# Patient Record
Sex: Male | Born: 1955 | Race: Black or African American | Hispanic: No | Marital: Single | Smoking: Current every day smoker
Health system: Southern US, Community
[De-identification: ages and names within clinical notes are randomized; demographics above are authoritative.]

## PROBLEM LIST (undated history)

## (undated) DIAGNOSIS — M1990 Unspecified osteoarthritis, unspecified site: Secondary | ICD-10-CM

---

## 2007-01-08 ENCOUNTER — Emergency Department: Payer: Self-pay | Admitting: Emergency Medicine

## 2008-05-19 ENCOUNTER — Emergency Department: Payer: Self-pay | Admitting: Emergency Medicine

## 2011-08-15 ENCOUNTER — Emergency Department: Payer: Self-pay | Admitting: Emergency Medicine

## 2011-08-19 ENCOUNTER — Emergency Department: Payer: Self-pay | Admitting: Emergency Medicine

## 2012-03-06 ENCOUNTER — Emergency Department: Payer: Self-pay | Admitting: Emergency Medicine

## 2014-11-22 ENCOUNTER — Emergency Department: Payer: Self-pay | Admitting: Emergency Medicine

## 2014-11-22 LAB — URIC ACID: Uric Acid: 5.3 mg/dL (ref 3.5–7.2)

## 2015-09-28 ENCOUNTER — Encounter: Payer: Self-pay | Admitting: Emergency Medicine

## 2015-09-28 ENCOUNTER — Emergency Department
Admission: EM | Admit: 2015-09-28 | Discharge: 2015-09-28 | Disposition: A | Discharge status: Home / Self Care | Payer: BLUE CROSS/BLUE SHIELD | Attending: Emergency Medicine | Admitting: Emergency Medicine

## 2015-09-28 DIAGNOSIS — Z720 Tobacco use: Secondary | ICD-10-CM | POA: Diagnosis not present

## 2015-09-28 DIAGNOSIS — K0889 Other specified disorders of teeth and supporting structures: Secondary | ICD-10-CM | POA: Diagnosis present

## 2015-09-28 DIAGNOSIS — K047 Periapical abscess without sinus: Secondary | ICD-10-CM

## 2015-09-28 MED ORDER — TRAMADOL HCL 50 MG PO TABS: 50.0000 mg | ORAL_TABLET | Freq: Four times a day (QID) | ORAL | Status: DC | PRN

## 2015-09-28 MED ORDER — IBUPROFEN 800 MG PO TABS: 800.0000 mg | ORAL_TABLET | Freq: Three times a day (TID) | ORAL | Status: DC | PRN

## 2015-09-28 MED ORDER — IBUPROFEN 800 MG PO TABS
800.0000 mg | ORAL_TABLET | Freq: Once | ORAL | Status: AC
  Administered 2015-09-28: 800 mg via ORAL

## 2015-09-28 MED ORDER — IBUPROFEN 800 MG PO TABS
ORAL_TABLET | ORAL | Status: AC
  Administered 2015-09-28: 800 mg via ORAL
  Filled 2015-09-28: qty 1

## 2015-09-28 MED ORDER — AMOXICILLIN 500 MG PO TABS: 500.0000 mg | ORAL_TABLET | Freq: Three times a day (TID) | ORAL | Status: DC

## 2015-09-28 NOTE — ED Notes (Signed)
Pt c/o pain to his top row of teeth that is now causing swelling. Pt reports pain started yesterday and he has no dentist. Denies fevers.

## 2015-09-28 NOTE — Discharge Instructions (Signed)
Dental Abscess °A dental abscess is a collection of pus in or around a tooth. °CAUSES °This condition is caused by a bacterial infection around the root of the tooth that involves the inner part of the tooth (pulp). It may result from: °· Severe tooth decay. °· Trauma to the tooth that allows bacteria to enter into the pulp, such as a broken or chipped tooth. °· Severe gum disease around a tooth. °SYMPTOMS °Symptoms of this condition include: °· Severe pain in and around the infected tooth. °· Swelling and redness around the infected tooth, in the mouth, or in the face. °· Tenderness. °· Pus drainage. °· Bad breath. °· Bitter taste in the mouth. °· Difficulty swallowing. °· Difficulty opening the mouth. °· Nausea. °· Vomiting. °· Chills. °· Swollen neck glands. °· Fever. °DIAGNOSIS °This condition is diagnosed with examination of the infected tooth. During the exam, your dentist may tap on the infected tooth. Your dentist will also ask about your medical and dental history and may order X-rays. °TREATMENT °This condition is treated by eliminating the infection. This may be done with: °· Antibiotic medicine. °· A root canal. This may be performed to save the tooth. °· Pulling (extracting) the tooth. This may also involve draining the abscess. This is done if the tooth cannot be saved. °HOME CARE INSTRUCTIONS °· Take medicines only as directed by your dentist. °· If you were prescribed antibiotic medicine, finish all of it even if you start to feel better. °· Rinse your mouth (gargle) often with salt water to relieve pain or swelling. °· Do not drive or operate heavy machinery while taking pain medicine. °· Do not apply heat to the outside of your mouth. °· Keep all follow-up visits as directed by your dentist. This is important. °SEEK MEDICAL CARE IF: °· Your pain is worse and is not helped by medicine. °SEEK IMMEDIATE MEDICAL CARE IF: °· You have a fever or chills. °· Your symptoms suddenly get worse. °· You have a  very bad headache. °· You have problems breathing or swallowing. °· You have trouble opening your mouth. °· You have swelling in your neck or around your eye. °  °This information is not intended to replace advice given to you by your health care provider. Make sure you discuss any questions you have with your health care provider. °  °Document Released: 11/18/2005 Document Revised: 04/04/2015 Document Reviewed: 11/15/2014 °Elsevier Interactive Patient Education ©2016 [employer] ° ° ° ° ° ° °OPTIONS FOR DENTAL FOLLOW UP CARE ° °[place] Department of Health and Human Services - Local Safety Net Dental Clinics °http://www.ncdhhs.gov/dph/oralhealth/services/safetynetclinics.htm °  °[facility] (336-562-3123) ° °[facility] (919-933-9087) ° °[facility] (919-663-1744 ext 237) ° °[facility] (336-570-6415) ° °[facility] (919-968-2025) °This clinic caters to the indigent population and is on a lottery system. °Location: °[facility], [address], [place] °Clinic Hours: °Wednesdays from 6pm - 9pm, patients seen by a lottery system. °For dates, call or go to www.med.[facility].edu/[facility]/patients/Dental-[facility] °Services: °Cleanings, fillings and simple extractions. °Payment Options: °DENTAL WORK IS FREE OF CHARGE. Bring proof of income or support. °Best way to get seen: °Arrive at 5:15 pm - this is a lottery, NOT first come/first serve, so arriving earlier will not increase your chances of being seen. °  °  °[facility] °919-537-3737 °Select option 1 for emergencies °  °Location: °[facility], [address], [place] °  Clinic Hours: °No walk-ins accepted - call the day before to schedule an appointment. °Check in times are 9:30 am and 1:30 pm. °Services: °Simple extractions, temporary fillings, pulpectomy/pulp debridement, uncomplicated abscess drainage. °Payment Options: °PAYMENT IS  DUE AT THE TIME OF SERVICE.  Fee is usually $100-200, additional surgical procedures (e.g. abscess drainage) may be extra. °Cash, checks, Visa/MasterCard accepted.  Can file Medicaid if patient is covered for dental - patient should call case worker to check. °No discount for [facility] patients. °Best way to get seen: °MUST call the day before and get onto the schedule. Can usually be seen the next 1-2 days. No walk-ins accepted. °  °  °[facility] °[phone] °  °Location: °[facility], [address], [place] °Clinic Hours: °M, W, Th, F 8am or 1:30pm, Tues 9a or 1:30 - first come/first served. °Services: °Simple extractions, temporary fillings, uncomplicated abscess drainage.  You do not need to be an [place] resident. °Payment Options: °PAYMENT IS DUE AT THE TIME OF SERVICE. °Dental insurance, otherwise sliding scale - bring proof of income or support. °Depending on income and treatment needed, cost is usually $50-200. °Best way to get seen: °Arrive early as it is first come/first served. °  °  °[facility] °919-542-1641 °  °Location: °[address] °Clinic Hours: °Mon-Thu 8a-5p °Services: °Most basic dental services including extractions and fillings. °Payment Options: °PAYMENT IS DUE AT THE TIME OF SERVICE. °Sliding scale, up to 50% off - bring proof if income or support. °Medicaid with dental option accepted. °Best way to get seen: °Call to schedule an appointment, can usually be seen within 2 weeks OR they will try to see walk-ins - show up at 8a or 2p (you may have to wait). °  °  °[facility] °919-245-2435 °[place] RESIDENTS ONLY °  °Location: °[facility], [address], [place], [place] [address] °Clinic Hours: By appointment only. °Monday - Thursday 8am-5pm, Friday 8am-12pm °Services: Cleanings, fillings, extractions. °Payment Options: °PAYMENT IS DUE AT THE TIME OF  SERVICE. °Cash, Visa or MasterCard. Sliding scale - $30 minimum per service. °Best way to get seen: °Come in to office, complete packet and make an appointment - need proof of income °or support monies for each household member and proof of [place] residence. °Usually takes about a month to get in. °  °  °[facility] °[phone] °  °Location: °[address] [place] °Clinic Hours: Walk-in Urgent Care Dental Services are offered Monday-Friday mornings only. °The numbers of emergencies accepted daily is limited to the number of °providers available. °Maximum 15 - Mondays, Wednesdays & Thursdays °Maximum 10 - Tuesdays & Fridays °Services: °You do not need to be a [place] resident to be seen for a dental emergency. °Emergencies are defined as pain, swelling, abnormal bleeding, or dental trauma. Walkins will receive x-rays if needed. °NOTE: Dental cleaning is not an emergency. °Payment Options: °PAYMENT IS DUE AT THE TIME OF SERVICE. °Minimum co-pay is $40.00 for uninsured patients. °Minimum co-pay is $3.00 for Medicaid with dental coverage. °Dental Insurance is accepted and must be presented at time of visit. °Medicare does not cover dental. °Forms of payment: Cash, credit card, checks. °Best way to get seen: °If not previously registered with the clinic, walk-in dental registration begins at 7:15 am and is on a first come/first serve basis. °If previously registered with the clinic, call to make an appointment. °  °  °  [facility] °919-776-4359 °[place] RESIDENTS ONLY °  °Location: °[address], [place], [place] °Clinic Hours: °Mon-Thu 10a-2p °Services: Extractions only! °Payment Options: °FREE (donations accepted) - bring proof of income or support °Best way to get seen: °Call and schedule an appointment OR come at 8am on the 1st Monday of every month (except for holidays) when it is first come/first served. °  °  °[facility] °[phone] °   °Location: °[address], [place] °Clinic Hours: °Friday mornings °Services, Payment Options, Best way to get seen: °Call for info °

## 2015-09-28 NOTE — ED Provider Notes (Signed)
[facility] Emergency Department Provider Note  ____________________________________________  Time seen: Approximately 11:24 AM  I have reviewed the triage vital signs and the nursing notes.   HISTORY  Chief Complaint Dental Pain   HPI Shawn Mooney is a 59 y.o. male who presents to the emergency department for evaluation of dental pain. He reports having had pain for quite some time, but it worsened yesterday and he had swelling upon awakening this morning. He has not taken anything for pain.   History reviewed. No pertinent past medical history.  There are no active problems to display for this patient.   History reviewed. No pertinent past surgical history.  Current Outpatient Rx  Name  Route  Sig  Dispense  Refill  . amoxicillin (AMOXIL) 500 MG tablet   Oral   Take 1 tablet (500 mg total) by mouth 3 (three) times daily.   30 tablet   0   . ibuprofen (ADVIL,MOTRIN) 800 MG tablet   Oral   Take 1 tablet (800 mg total) by mouth every 8 (eight) hours as needed.   30 tablet   0   . traMADol (ULTRAM) 50 MG tablet   Oral   Take 1 tablet (50 mg total) by mouth every 6 (six) hours as needed.   9 tablet   0     Allergies Review of patient's allergies indicates no known allergies.  No family history on file.  Social History Social History  Substance Use Topics  . Smoking status: Current Every Day Smoker  . Smokeless tobacco: None  . Alcohol Use: Yes    Review of Systems Constitutional: No fever/chills Eyes: No visual changes. ENT: No sore throat. Cardiovascular: Denies chest pain. Respiratory: Denies shortness of breath. Gastrointestinal: No abdominal pain.  No nausea, no vomiting.  Genitourinary: Negative for dysuria. Musculoskeletal: Negative for back pain. Skin: Negative for rash. Neurological: Negative for headaches, focal weakness or numbness. 10-point ROS otherwise  negative.  ____________________________________________   PHYSICAL EXAM:  VITAL SIGNS: ED Triage Vitals  Enc Vitals Group     BP 09/28/15 1033 153/81 mmHg     Pulse Rate 09/28/15 1033 89     Resp 09/28/15 1033 20     Temp 09/28/15 1033 98.6 F (37 C)     Temp Source 09/28/15 1033 Oral     SpO2 09/28/15 1033 98 %     Weight 09/28/15 1033 215 lb (97.523 kg)     Height 09/28/15 1033 6\' 1"  (1.854 m)     Head Cir --      Peak Flow --      Pain Score 09/28/15 1034 7     Pain Loc --      Pain Edu? --      Excl. in GC? --     Constitutional: Alert and oriented. Well appearing and in no acute distress. Eyes: Conjunctivae are normal. PERRL. EOMI. Head: Atraumatic. Nose: No congestion/rhinnorhea. Mouth/Throat: Mucous membranes are moist.  Oropharynx non-erythematous. Periodontal Exam    Neck: No stridor.  Hematological/Lymphatic/Immunilogical: No cervical lymphadenopathy. Cardiovascular:   Good peripheral circulation. Respiratory: Normal respiratory effort.  No retractions. Musculoskeletal: No lower extremity tenderness nor edema.  No joint effusions. Neurologic:  Normal speech and language. No gross focal neurologic deficits are appreciated. Speech is normal. No gait instability. Skin:  Skin is warm, dry and intact. No rash noted. Psychiatric: Mood and affect are normal. Speech and behavior are normal.  ____________________________________________   LABS (all labs ordered are listed,  but only abnormal results are displayed)  Labs Reviewed - No data to display ____________________________________________   RADIOLOGY  Not indicated. ____________________________________________   PROCEDURES  Procedure(s) performed: None  Critical Care performed: No  ____________________________________________   INITIAL IMPRESSION / ASSESSMENT AND PLAN / ED COURSE  Pertinent labs & imaging results that were available during my care of the patient were reviewed by me and  considered in my medical decision making (see chart for details).  Patient was advised to see the dentist within 14 days. Also advised to take the antibiotic until finished. Instructed to return to the ER for symptoms that change or worsen if you are unable to schedule an appointment. ____________________________________________   FINAL CLINICAL IMPRESSION(S) / ED DIAGNOSES  Final diagnoses:  Dental abscess       Chinita Pester, FNP 09/28/15 1547  Emily Filbert, MD 09/29/15 2249

## 2016-01-09 ENCOUNTER — Encounter: Payer: Self-pay | Admitting: Emergency Medicine

## 2016-01-09 ENCOUNTER — Emergency Department: Payer: BLUE CROSS/BLUE SHIELD

## 2016-01-09 ENCOUNTER — Emergency Department
Admission: EM | Admit: 2016-01-09 | Discharge: 2016-01-09 | Disposition: A | Discharge status: Home / Self Care | Payer: BLUE CROSS/BLUE SHIELD | Attending: Emergency Medicine | Admitting: Emergency Medicine

## 2016-01-09 DIAGNOSIS — Y998 Other external cause status: Secondary | ICD-10-CM | POA: Diagnosis not present

## 2016-01-09 DIAGNOSIS — S8992XA Unspecified injury of left lower leg, initial encounter: Secondary | ICD-10-CM | POA: Diagnosis present

## 2016-01-09 DIAGNOSIS — Y9289 Other specified places as the place of occurrence of the external cause: Secondary | ICD-10-CM | POA: Diagnosis not present

## 2016-01-09 DIAGNOSIS — S86912A Strain of unspecified muscle(s) and tendon(s) at lower leg level, left leg, initial encounter: Secondary | ICD-10-CM | POA: Insufficient documentation

## 2016-01-09 DIAGNOSIS — F17200 Nicotine dependence, unspecified, uncomplicated: Secondary | ICD-10-CM | POA: Diagnosis not present

## 2016-01-09 DIAGNOSIS — Y9389 Activity, other specified: Secondary | ICD-10-CM | POA: Diagnosis not present

## 2016-01-09 DIAGNOSIS — W1839XA Other fall on same level, initial encounter: Secondary | ICD-10-CM | POA: Diagnosis not present

## 2016-01-09 MED ORDER — CYCLOBENZAPRINE HCL 5 MG PO TABS: 5.0000 mg | ORAL_TABLET | Freq: Three times a day (TID) | ORAL | Status: DC | PRN

## 2016-01-09 MED ORDER — IBUPROFEN 800 MG PO TABS: 800.0000 mg | ORAL_TABLET | Freq: Three times a day (TID) | ORAL | Status: DC | PRN

## 2016-01-09 NOTE — ED Notes (Signed)
C/o left knee pain.  

## 2016-01-09 NOTE — ED Provider Notes (Signed)
[facility] Emergency Department Provider Note  ____________________________________________  Time seen: Approximately 10:43 AM  I have reviewed the triage vital signs and the nursing notes.   HISTORY  Chief Complaint Knee Pain    HPI Shawn Mooney is a 60 y.o. male resents for evaluation following yesterday landing on his left side. Complains of having pain to his left knee with no swelling noted. Patient denies any other direct trauma to the knee. Increased pain when trying to ambulate. His pain is 8/10 and nonradiating stating to the middle part of the knee.   History reviewed. No pertinent past medical history.  There are no active problems to display for this patient.   History reviewed. No pertinent past surgical history.  Current Outpatient Rx  Name  Route  Sig  Dispense  Refill  . cyclobenzaprine (FLEXERIL) 5 MG tablet   Oral   Take 1 tablet (5 mg total) by mouth every 8 (eight) hours as needed for muscle spasms.   30 tablet   0   . ibuprofen (ADVIL,MOTRIN) 800 MG tablet   Oral   Take 1 tablet (800 mg total) by mouth every 8 (eight) hours as needed.   30 tablet   0     Allergies Review of patient's allergies indicates no known allergies.  History reviewed. No pertinent family history.  Social History Social History  Substance Use Topics  . Smoking status: Current Every Day Smoker  . Smokeless tobacco: None  . Alcohol Use: Yes    Review of Systems Constitutional: No fever/chills Eyes: No visual changes. ENT: No sore throat. Cardiovascular: Denies chest pain. Respiratory: Denies shortness of breath. Gastrointestinal: No abdominal pain.  No nausea, no vomiting.  No diarrhea.  No constipation. Genitourinary: Negative for dysuria. Musculoskeletal: Positive for left knee pain. Skin: Negative for rash. Neurological: Negative for headaches, focal weakness or numbness.  10-point ROS otherwise  negative.  ____________________________________________   PHYSICAL EXAM:  VITAL SIGNS: ED Triage Vitals  Enc Vitals Group     BP 01/09/16 0918 146/87 mmHg     Pulse Rate 01/09/16 0918 88     Resp 01/09/16 0918 18     Temp 01/09/16 0918 97.8 F (36.6 C)     Temp Source 01/09/16 0918 Oral     SpO2 01/09/16 0918 98 %     Weight 01/09/16 0918 213 lb (96.616 kg)     Height 01/09/16 0918 6\' 2"  (1.88 m)     Head Cir --      Peak Flow --      Pain Score 01/09/16 0914 8     Pain Loc --      Pain Edu? --      Excl. in GC? --     Constitutional: Alert and oriented. Well appearing and in no acute distress. Cardiovascular: Normal rate, regular rhythm. Grossly normal heart sounds.  Good peripheral circulation. Respiratory: Normal respiratory effort.  No retractions. Lungs CTAB. Gastrointestinal: Soft and nontender. No distention. No abdominal bruits. No CVA tenderness. Musculoskeletal: No lower extremity tenderness nor edema.  No joint effusions. Distally neurovascular intact. No ecchymosis or bruising noted. Point tenderness noted to the medial aspect of the left knee. Neurologic:  Normal speech and language. No gross focal neurologic deficits are appreciated. No gait instability. Skin:  Skin is warm, dry and intact. No rash noted. Psychiatric: Mood and affect are normal. Speech and behavior are normal.  ____________________________________________   LABS (all labs ordered are listed, but only abnormal  results are displayed)  Labs Reviewed - No data to display ____________________________________________  RADIOLOGY  Negative for any acute osseous findings. ____________________________________________   PROCEDURES  Procedure(s) performed: None  Critical Care performed: No  ____________________________________________   INITIAL IMPRESSION / ASSESSMENT AND PLAN / ED COURSE  Pertinent labs & imaging results that were available during my care of the patient were reviewed by  me and considered in my medical decision making (see chart for details).  Acute left knee strain. Rx given for Motrin 800 mg 3 times a day and Flexeril 5 mg 3 times a day. Patient follow-up PCP or return to the ER with any worsening symptomology. Patient to continue to ambulate and work excuse given 48 hours. ____________________________________________   FINAL CLINICAL IMPRESSION(S) / ED DIAGNOSES  Final diagnoses:  Knee strain, left, initial encounter     This chart was dictated using voice recognition software/Dragon. Despite best efforts to proofread, errors can occur which can change the meaning. Any change was purely unintentional.   Evangeline Dakin, PA-C 01/09/16 1105  Emily Filbert, MD 01/09/16 [phone]

## 2016-01-09 NOTE — ED Notes (Signed)
States he fell on Sunday  Landed on left side  But having pain to left knee ..,no swelling noted   States pain is lateral  Ambulates with sl limp d/t pain

## 2016-12-19 ENCOUNTER — Emergency Department
Admission: EM | Admit: 2016-12-19 | Discharge: 2016-12-19 | Disposition: A | Discharge status: Home / Self Care | Payer: BLUE CROSS/BLUE SHIELD | Attending: Emergency Medicine | Admitting: Emergency Medicine

## 2016-12-19 ENCOUNTER — Emergency Department: Payer: BLUE CROSS/BLUE SHIELD

## 2016-12-19 DIAGNOSIS — M1000 Idiopathic gout, unspecified site: Secondary | ICD-10-CM | POA: Insufficient documentation

## 2016-12-19 DIAGNOSIS — F17200 Nicotine dependence, unspecified, uncomplicated: Secondary | ICD-10-CM | POA: Insufficient documentation

## 2016-12-19 DIAGNOSIS — Z791 Long term (current) use of non-steroidal anti-inflammatories (NSAID): Secondary | ICD-10-CM | POA: Diagnosis not present

## 2016-12-19 DIAGNOSIS — M109 Gout, unspecified: Secondary | ICD-10-CM

## 2016-12-19 DIAGNOSIS — M7989 Other specified soft tissue disorders: Secondary | ICD-10-CM | POA: Diagnosis present

## 2016-12-19 MED ORDER — NAPROXEN 500 MG PO TABS
500.0000 mg | ORAL_TABLET | Freq: Once | ORAL | Status: AC
  Administered 2016-12-19: 500 mg via ORAL
  Filled 2016-12-19: qty 1

## 2016-12-19 MED ORDER — INDOMETHACIN 50 MG PO CAPS: 50.0000 mg | ORAL_CAPSULE | Freq: Three times a day (TID) | ORAL | 0 refills | Status: DC

## 2016-12-19 NOTE — ED Provider Notes (Signed)
[facility] Emergency Department Provider Note ____________________________________________  Time seen: Approximately 4:34 PM  I have reviewed the triage vital signs and the nursing notes.   HISTORY  Chief Complaint Hand Pain    HPI Shawn Mooney is a 61 y.o. male who presents to the emergency department for evaluation of right ring finger is swelling and pain. He states that the symptoms have been present intermittently for the past year. He denies injury. Swelling got worse several days ago. He has not taken any medications for pain relief, though he has soaked his hand in Epsom salt.  No past medical history on file.  There are no active problems to display for this patient.   No past surgical history on file.  Prior to Admission medications   Medication Sig Start Date End Date Taking? Authorizing Provider  cyclobenzaprine (FLEXERIL) 5 MG tablet Take 1 tablet (5 mg total) by mouth every 8 (eight) hours as needed for muscle spasms. 01/09/16   Charmayne Sheer Beers, PA-C  ibuprofen (ADVIL,MOTRIN) 800 MG tablet Take 1 tablet (800 mg total) by mouth every 8 (eight) hours as needed. 01/09/16   Evangeline Dakin, PA-C    Allergies Patient has no known allergies.  No family history on file.  Social History Social History  Substance Use Topics  . Smoking status: Current Every Day Smoker  . Smokeless tobacco: Not on file  . Alcohol use Yes    Review of Systems Constitutional: No recent illness. Cardiovascular: Denies chest pain or palpitations. Respiratory: Denies shortness of breath. Musculoskeletal: Pain in Right ring finger. Skin: Negative for rash, wound, lesion. Neurological: Negative for focal weakness or numbness.  ____________________________________________   PHYSICAL EXAM:  VITAL SIGNS: ED Triage Vitals  Enc Vitals Group     BP 12/19/16 1523 (!) 168/100     Pulse Rate 12/19/16 1523 84     Resp 12/19/16 1523 18     Temp 12/19/16 1523  98.1 F (36.7 C)     Temp Source 12/19/16 1523 Oral     SpO2 12/19/16 1523 99 %     Weight 12/19/16 1523 214 lb (97.1 kg)     Height 12/19/16 1523 6\' 1"  (1.854 m)     Head Circumference --      Peak Flow --      Pain Score 12/19/16 1524 8     Pain Loc --      Pain Edu? --      Excl. in GC? --     Constitutional: Alert and oriented. Well appearing and in no acute distress. Eyes: Conjunctivae are normal. EOMI. Head: Atraumatic. Neck: No stridor.  Respiratory: Normal respiratory effort.   Musculoskeletal: Right ring finger PIP swollen and tender to palpation. Neurologic:  Normal speech and language. No gross focal neurologic deficits are appreciated. Speech is normal. No gait instability. Skin:  Skin is warm, dry and intact. Atraumatic. Psychiatric: Mood and affect are normal. Speech and behavior are normal.  ____________________________________________   LABS (all labs ordered are listed, but only abnormal results are displayed)  Labs Reviewed - No data to display ____________________________________________  RADIOLOGY  Right ring finger x-ray consistent with gouty arthritis. ____________________________________________   PROCEDURES  Procedure(s) performed: None   ____________________________________________   INITIAL IMPRESSION / ASSESSMENT AND PLAN / ED COURSE     Pertinent labs & imaging results that were available during my care of the patient were reviewed by me and considered in my medical decision making (see chart for  details).  61 year old male presented to the emergency department for evaluation of pain and swelling of the right ring finger. X-ray and symptoms are consistent with gouty arthritis. Today he was prescribed indomethacin and advised to take it 3 times a day with meals. He was instructed to follow up with primary care or orthopedics for symptoms that are not improving with medication. He was instructed to return to the emergency department for  symptoms that change or worsen if he is unable schedule an appointment. ____________________________________________   FINAL CLINICAL IMPRESSION(S) / ED DIAGNOSES  Final diagnoses:  None       Chinita Pester, FNP 12/19/16 0600    Jennye Moccasin, MD 12/20/16 0008

## 2016-12-19 NOTE — ED Notes (Signed)
Pt with swelling to R ring finger, denies injury or hx of gout. Pt states he has soaked hand in epsom salt and denies pain at this time. States swelling x 1 year consistently.

## 2016-12-19 NOTE — ED Triage Notes (Signed)
Pt reports right ring finger pain intermittently for one year. Pt reports pain worse the past couple days and has been swelling. Denies injury.

## 2016-12-19 NOTE — Discharge Instructions (Signed)
Take the medication as prescribed. Follow up with the orthopedic doctor or your primary care provider for symptoms that are not improving over the next 5-7 days. Return to the ER for symptoms that change or worsen if you are unable to schedule an appointment.

## 2018-02-17 ENCOUNTER — Emergency Department
Admission: EM | Admit: 2018-02-17 | Discharge: 2018-02-17 | Disposition: A | Discharge status: Home / Self Care | Payer: BLUE CROSS/BLUE SHIELD | Attending: Emergency Medicine | Admitting: Emergency Medicine

## 2018-02-17 ENCOUNTER — Other Ambulatory Visit: Payer: Self-pay

## 2018-02-17 ENCOUNTER — Encounter: Payer: Self-pay | Admitting: Emergency Medicine

## 2018-02-17 ENCOUNTER — Emergency Department: Payer: BLUE CROSS/BLUE SHIELD

## 2018-02-17 DIAGNOSIS — M1812 Unilateral primary osteoarthritis of first carpometacarpal joint, left hand: Secondary | ICD-10-CM | POA: Insufficient documentation

## 2018-02-17 DIAGNOSIS — Z79899 Other long term (current) drug therapy: Secondary | ICD-10-CM | POA: Insufficient documentation

## 2018-02-17 DIAGNOSIS — M79645 Pain in left finger(s): Secondary | ICD-10-CM | POA: Diagnosis present

## 2018-02-17 DIAGNOSIS — F17200 Nicotine dependence, unspecified, uncomplicated: Secondary | ICD-10-CM | POA: Diagnosis not present

## 2018-02-17 LAB — CBC WITH DIFFERENTIAL/PLATELET
Basophils Absolute: 0 10*3/uL (ref 0–0.1)
Basophils Relative: 1 %
EOS ABS: 0.1 10*3/uL (ref 0–0.7)
EOS PCT: 2 %
HCT: 37.4 % — ABNORMAL LOW (ref 40.0–52.0)
HEMOGLOBIN: 12.7 g/dL — AB (ref 13.0–18.0)
LYMPHS PCT: 29 %
Lymphs Abs: 1.1 10*3/uL (ref 1.0–3.6)
MCH: 32.6 pg (ref 26.0–34.0)
MCHC: 34.1 g/dL (ref 32.0–36.0)
MCV: 95.8 fL (ref 80.0–100.0)
Monocytes Absolute: 0.5 10*3/uL (ref 0.2–1.0)
Monocytes Relative: 13 %
NEUTROS PCT: 55 %
Neutro Abs: 2.1 10*3/uL (ref 1.4–6.5)
PLATELETS: 210 10*3/uL (ref 150–440)
RBC: 3.91 MIL/uL — AB (ref 4.40–5.90)
RDW: 14.7 % — ABNORMAL HIGH (ref 11.5–14.5)
WBC: 3.8 10*3/uL (ref 3.8–10.6)

## 2018-02-17 LAB — SEDIMENTATION RATE: SED RATE: 36 mm/h — AB (ref 0–20)

## 2018-02-17 LAB — URIC ACID: Uric Acid, Serum: 5.9 mg/dL (ref 4.4–7.6)

## 2018-02-17 MED ORDER — MELOXICAM 7.5 MG PO TABS: 7.5000 mg | ORAL_TABLET | Freq: Every day | ORAL | 2 refills | Status: DC

## 2018-02-17 NOTE — ED Triage Notes (Signed)
Developed pain and swelling to left thumb area since Friday  Unsure of injury

## 2018-02-17 NOTE — ED Provider Notes (Signed)
[facility] Emergency Department Provider Note   ____________________________________________   First MD Initiated Contact with Patient 02/17/18 1207     (approximate)  I have reviewed the triage vital signs and the nursing notes.   HISTORY  Chief Complaint Hand Pain    HPI Shawn Mooney is a 62 y.o. male patient complain of pain and swelling to the left thumb for 4 days.  Patient cannot think of a provocative incident but does perform repetitive heavy lifting at work.  Patient rates the pain as 8/10.  Patient described the pain is "aching".  No palliative measure for complaint.  Patient is right-hand dominant.  History reviewed. No pertinent past medical history.  There are no active problems to display for this patient.   History reviewed. No pertinent surgical history.  Prior to Admission medications   Medication Sig Start Date End Date Taking? Authorizing Provider  cyclobenzaprine (FLEXERIL) 5 MG tablet Take 1 tablet (5 mg total) by mouth every 8 (eight) hours as needed for muscle spasms. 01/09/16   Beers, Charmayne Sheer, PA-C  ibuprofen (ADVIL,MOTRIN) 800 MG tablet Take 1 tablet (800 mg total) by mouth every 8 (eight) hours as needed. 01/09/16   Beers, Charmayne Sheer, PA-C  indomethacin (INDOCIN) 50 MG capsule Take 1 capsule (50 mg total) by mouth 3 (three) times daily with meals. 12/19/16   Triplett, Rulon Eisenmenger B, FNP  meloxicam (MOBIC) 7.5 MG tablet Take 1 tablet (7.5 mg total) by mouth daily. 02/17/18   Joni Reining, PA-C    Allergies Patient has no known allergies.  No family history on file.  Social History Social History   Tobacco Use  . Smoking status: Current Every Day Smoker  . Smokeless tobacco: Never Used  Substance Use Topics  . Alcohol use: Yes  . Drug use: No    Review of Systems Constitutional: No fever/chills Eyes: No visual changes. ENT: No sore throat. Cardiovascular: Denies chest pain. Respiratory: Denies shortness of  breath. Gastrointestinal: No abdominal pain.  No nausea, no vomiting.  No diarrhea.  No constipation. Genitourinary: Negative for dysuria. Musculoskeletal: Left thumb pain. Skin: Negative for rash. Neurological: Negative for headaches, focal weakness or numbness.   ____________________________________________   PHYSICAL EXAM:  VITAL SIGNS: ED Triage Vitals  Enc Vitals Group     BP 02/17/18 1200 (!) 146/92     Pulse Rate 02/17/18 1200 78     Resp 02/17/18 1200 14     Temp 02/17/18 1200 98.1 F (36.7 C)     Temp Source 02/17/18 1200 Oral     SpO2 02/17/18 1200 99 %     Weight 02/17/18 1157 220 lb (99.8 kg)     Height 02/17/18 1157 6\' 2"  (1.88 m)     Head Circumference --      Peak Flow --      Pain Score 02/17/18 1157 8     Pain Loc --      Pain Edu? --      Excl. in GC? --    Constitutional: Alert and oriented. Well appearing and in no acute distress. Cardiovascular: Normal rate, regular rhythm. Grossly normal heart sounds.  Good peripheral circulation. Respiratory: Normal respiratory effort.  No retractions. Lungs CTAB. Gastrointestinal: Soft and nontender. No distention. No abdominal bruits. No CVA tenderness. Musculoskeletal: No obvious deformity to the left thumb.  Moderate edema but no erythema.  Patient has moderate guarding palpation at the first metacarpal head.  Patient has full equal range of motion  of the affected digit. Neurologic:  Normal speech and language. No gross focal neurologic deficits are appreciated. No gait instability. Skin:  Skin is warm, dry and intact. No rash noted. Psychiatric: Mood and affect are normal. Speech and behavior are normal.  ____________________________________________   LABS (all labs ordered are listed, but only abnormal results are displayed)  Labs Reviewed  SEDIMENTATION RATE - Abnormal; Notable for the following components:      Result Value   Sed Rate 36 (*)    All other components within normal limits  CBC WITH  DIFFERENTIAL/PLATELET - Abnormal; Notable for the following components:   RBC 3.91 (*)    Hemoglobin 12.7 (*)    HCT 37.4 (*)    RDW 14.7 (*)    All other components within normal limits  URIC ACID   ____________________________________________  EKG   ____________________________________________  RADIOLOGY  X-rays show severe degenerative changes of the right thumb.  Official radiology report(s): Dg Finger Thumb Left  Result Date: 02/17/2018 CLINICAL DATA:  Pain MCP joint right thumb.  No known injury. EXAM: LEFT THUMB 2+V COMPARISON:  No recent prior. FINDINGS: Diffuse soft tissue swelling about the left thumb may be present. No radiopaque foreign body. Prominent erosive changes noted the base of the left first metacarpal. These changes may be inflammatory and related to a process such as gout. At infectious or malignant etiology cannot be excluded. Diffuse degenerative changes. Degenerative change particularly severe about the first carpometacarpal joint and metacarpal phalangeal joint. No acute bony abnormality. IMPRESSION: 1. Diffuse soft tissue swelling about the left thumb may be present. Prominent erosive changes noted about the base of the left thumb. This could be secondary to an inflammatory process such as gout. Infectious or malignant etiology cannot be excluded. 2. Severe degenerative changes right thumb particularly the first carpometacarpal joint and metacarpophalangeal joint. Adjacent corticated bony densities are most likely degenerative. Electronically Signed   By: Maisie Fus  Register   On: 02/17/2018 13:22    ____________________________________________   PROCEDURES  Procedure(s) performed: None  Procedures  Critical Care performed: No  ____________________________________________   INITIAL IMPRESSION / ASSESSMENT AND PLAN / ED COURSE  As part of my medical decision making, I reviewed the following data within the electronic MEDICAL RECORD NUMBER    Patient  presents with left thumb pain and edema for 4 days.  Patient denies provocative incident for complaint.  X-rays show severe degenerative changes suggesting the inflammatory changes may be related to gout.  Patient uric acid level was within normal range although his sed rate was elevated.  CBC was in normal range.  Discussed lab and x-ray findings with patient.  Patient given discharge care instruction and prescription for meloxicam pending establishing care with her family doctor.   ____________________________________________   FINAL CLINICAL IMPRESSION(S) / ED DIAGNOSES  Final diagnoses:  Primary osteoarthritis of first carpometacarpal joint of left hand     ED Discharge Orders        Ordered    meloxicam (MOBIC) 7.5 MG tablet  Daily     02/17/18 1411       Note:  This document was prepared using Dragon voice recognition software and may include unintentional dictation errors.    Joni Reining, PA-C 02/17/18 1416    Nita Sickle, MD 02/18/18 [phone]

## 2018-12-17 ENCOUNTER — Encounter: Payer: Self-pay | Admitting: Emergency Medicine

## 2018-12-17 ENCOUNTER — Emergency Department
Admission: EM | Admit: 2018-12-17 | Discharge: 2018-12-17 | Disposition: A | Discharge status: Home / Self Care | Payer: BLUE CROSS/BLUE SHIELD | Attending: Emergency Medicine | Admitting: Emergency Medicine

## 2018-12-17 ENCOUNTER — Emergency Department: Payer: BLUE CROSS/BLUE SHIELD

## 2018-12-17 DIAGNOSIS — F17210 Nicotine dependence, cigarettes, uncomplicated: Secondary | ICD-10-CM | POA: Insufficient documentation

## 2018-12-17 DIAGNOSIS — M25562 Pain in left knee: Secondary | ICD-10-CM | POA: Insufficient documentation

## 2018-12-17 DIAGNOSIS — M25561 Pain in right knee: Secondary | ICD-10-CM | POA: Insufficient documentation

## 2018-12-17 MED ORDER — DEXAMETHASONE SODIUM PHOSPHATE 10 MG/ML IJ SOLN
10.0000 mg | Freq: Once | INTRAMUSCULAR | Status: AC
  Administered 2018-12-17: 10 mg via INTRAMUSCULAR

## 2018-12-17 MED ORDER — MELOXICAM 15 MG PO TABS: 15.0000 mg | ORAL_TABLET | Freq: Every day | ORAL | 1 refills | Status: AC

## 2018-12-17 MED ORDER — DEXAMETHASONE SODIUM PHOSPHATE 10 MG/ML IJ SOLN
INTRAMUSCULAR | Status: AC
  Filled 2018-12-17: qty 1

## 2018-12-17 NOTE — ED Triage Notes (Signed)
Pt reports he called the paramedics on Tuesday when his knees started hurting and they told him it was related to all that walking that he does.

## 2018-12-17 NOTE — ED Triage Notes (Signed)
Pt reports pain to both knees since Tuesday. Pt denies injuries but states that he does a lot of walking. Pt states has an ace bandage on one knee and it is helping.

## 2018-12-17 NOTE — ED Provider Notes (Signed)
[facility] Emergency Department Provider Note  ____________________________________________  Time seen: Approximately 8:46 PM  I have reviewed the triage vital signs and the nursing notes.   HISTORY  Chief Complaint Knee Pain    HPI Shawn Mooney is a 63 y.o. male presents to the emergency department with acute bilateral knee pain for the past three days.  Patient denies falls or traumas.  Patient has been diagnosed with arthritis in the past.  Patient reports that his knees throb during the middle of the night.  No numbness or tingling in the lower extremities.  No subjective weakness.  Patient has been taking Aleve.  He has not been under the care of orthopedics.   History reviewed. No pertinent past medical history.  There are no active problems to display for this patient.   History reviewed. No pertinent surgical history.  Prior to Admission medications   Medication Sig Start Date End Date Taking? Authorizing Provider  cyclobenzaprine (FLEXERIL) 5 MG tablet Take 1 tablet (5 mg total) by mouth every 8 (eight) hours as needed for muscle spasms. 01/09/16   Beers, Charmayne Sheerharles M, PA-C  meloxicam (MOBIC) 15 MG tablet Take 1 tablet (15 mg total) by mouth daily for 7 days. 12/17/18 12/24/18  Orvil FeilWoods, Lyndel Sarate M, PA-C    Allergies Patient has no known allergies.  No family history on file.  Social History Social History   Tobacco Use  . Smoking status: Current Every Day Smoker  . Smokeless tobacco: Never Used  Substance Use Topics  . Alcohol use: Yes  . Drug use: No     Review of Systems  Constitutional: No fever/chills Eyes: No visual changes. No discharge ENT: No upper respiratory complaints. Cardiovascular: no chest pain. Respiratory: no cough. No SOB. Gastrointestinal: No abdominal pain.  No nausea, no vomiting.  No diarrhea.  No constipation. Musculoskeletal: Patient has bilateral knee pain.  Skin: Negative for rash, abrasions, lacerations,  ecchymosis. Neurological: Negative for headaches, focal weakness or numbness.   ____________________________________________   PHYSICAL EXAM:  VITAL SIGNS: ED Triage Vitals [12/17/18 1434]  Enc Vitals Group     BP (!) 129/105     Pulse Rate 85     Resp 20     Temp 98.1 F (36.7 C)     Temp Source Oral     SpO2 95 %     Weight 215 lb (97.5 kg)     Height 6\' 3"  (1.905 m)     Head Circumference      Peak Flow      Pain Score 10     Pain Loc      Pain Edu?      Excl. in GC?      Constitutional: Alert and oriented. Well appearing and in no acute distress. Eyes: Conjunctivae are normal. PERRL. EOMI. Head: Atraumatic. Cardiovascular: Normal rate, regular rhythm. Normal S1 and S2.  Good peripheral circulation. Respiratory: Normal respiratory effort without tachypnea or retractions. Lungs CTAB. Good air entry to the bases with no decreased or absent breath sounds. Musculoskeletal: Full range of motion to all extremities. No gross deformities appreciated.  Patient has pain with palpation over the medial compartment of both knees.  No other deficits noted with provocative testing bilaterally.  Palpable dorsalis pedis pulse bilaterally and symmetrically. Neurologic:  Normal speech and language. No gross focal neurologic deficits are appreciated.  Skin:  Skin is warm, dry and intact. No rash noted. Psychiatric: Mood and affect are normal. Speech and behavior are  normal. Patient exhibits appropriate insight and judgement.   ____________________________________________   LABS (all labs ordered are listed, but only abnormal results are displayed)  Labs Reviewed - No data to display ____________________________________________  EKG   ____________________________________________  RADIOLOGY I personally viewed and evaluated these images as part of my medical decision making, as well as reviewing the written report by the radiologist.  Dg Knee Complete 4 Views Left  Result  Date: 12/17/2018 CLINICAL DATA:  Knee pain. EXAM: LEFT KNEE - COMPLETE 4+ VIEW COMPARISON:  None. FINDINGS: Normal anatomic alignment. No evidence for acute fracture or dislocation. Crescentic calcification adjacent to the fibular head. Vascular calcifications. Mild degenerative changes medial and lateral compartment. No joint effusion. IMPRESSION: Crescentic calcification adjacent to the fibular head may represent degenerative changes or sequelae of prior trauma. Recommend correlation for point tenderness. Mild medial and lateral compartment degenerative changes. Electronically Signed   By: Annia Belt M.D.   On: 12/17/2018 15:37   Dg Knee Complete 4 Views Right  Result Date: 12/17/2018 CLINICAL DATA:  Patient with knee pain. EXAM: RIGHT KNEE - COMPLETE 4+ VIEW COMPARISON:  None. FINDINGS: No evidence of fracture, dislocation, or joint effusion. No evidence of arthropathy or other focal bone abnormality. Soft tissues are unremarkable. IMPRESSION: Negative. Electronically Signed   By: Annia Belt M.D.   On: 12/17/2018 15:35    ____________________________________________    PROCEDURES  Procedure(s) performed:    Procedures    Medications  dexamethasone (DECADRON) injection 10 mg (10 mg Intramuscular Given 12/17/18 1631)     ____________________________________________   INITIAL IMPRESSION / ASSESSMENT AND PLAN / ED COURSE  Pertinent labs & imaging results that were available during my care of the patient were reviewed by me and considered in my medical decision making (see chart for details).  Review of the [place] CSRS was performed in accordance of the [facility] prior to dispensing any controlled drugs.      Assessment and Plan: Knee pain Patient presents to the emergency department with bilateral knee pain for the past 3 days.  X-ray examination of the bilateral knees reveal degenerative changes in the medial compartment of the bilateral knees.  Patient was started on meloxicam.  I  recommended ice application nightly.  A referral was given to orthopedics.  All patient questions were answered.    ____________________________________________  FINAL CLINICAL IMPRESSION(S) / ED DIAGNOSES  Final diagnoses:  Pain in both knees, unspecified chronicity      NEW MEDICATIONS STARTED DURING THIS VISIT:  ED Discharge Orders         Ordered    meloxicam (MOBIC) 15 MG tablet  Daily     12/17/18 1612              This chart was dictated using voice recognition software/Dragon. Despite best efforts to proofread, errors can occur which can change the meaning. Any change was purely unintentional.    Shawn Mooney 12/17/18 2049    Sharman Cheek, MD 12/24/18 [phone]

## 2019-03-23 IMAGING — CR DG KNEE COMPLETE 4+V*R*
4 series · 4 of 4 positions shown · non-contrast
Comparison: None.

CLINICAL DATA: Patient with knee pain.

EXAM:
RIGHT KNEE - COMPLETE 4+ VIEW

[knee ap]
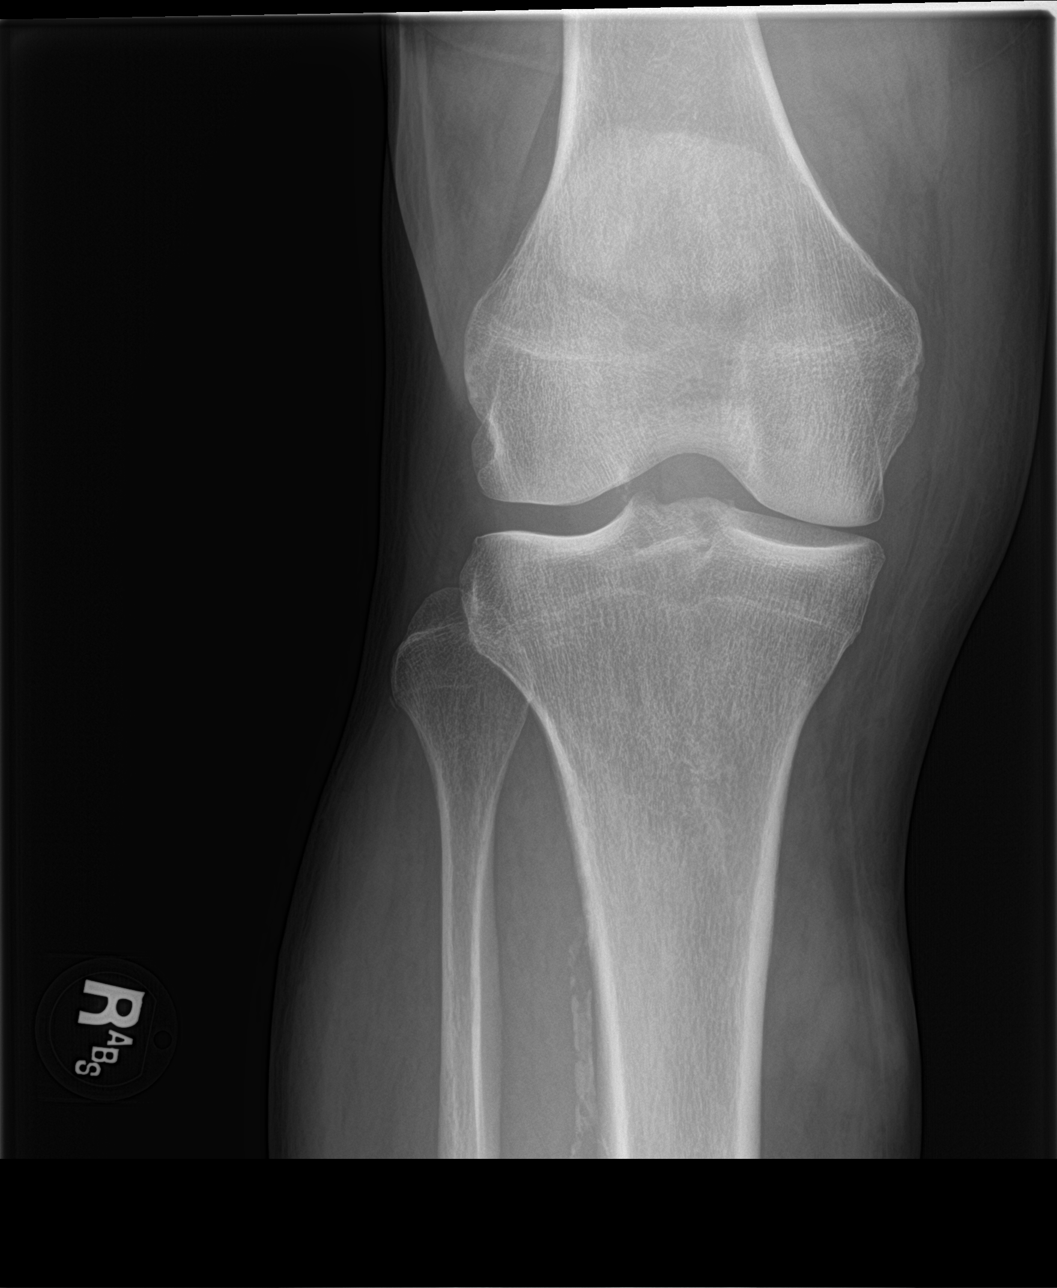

[knee obl]
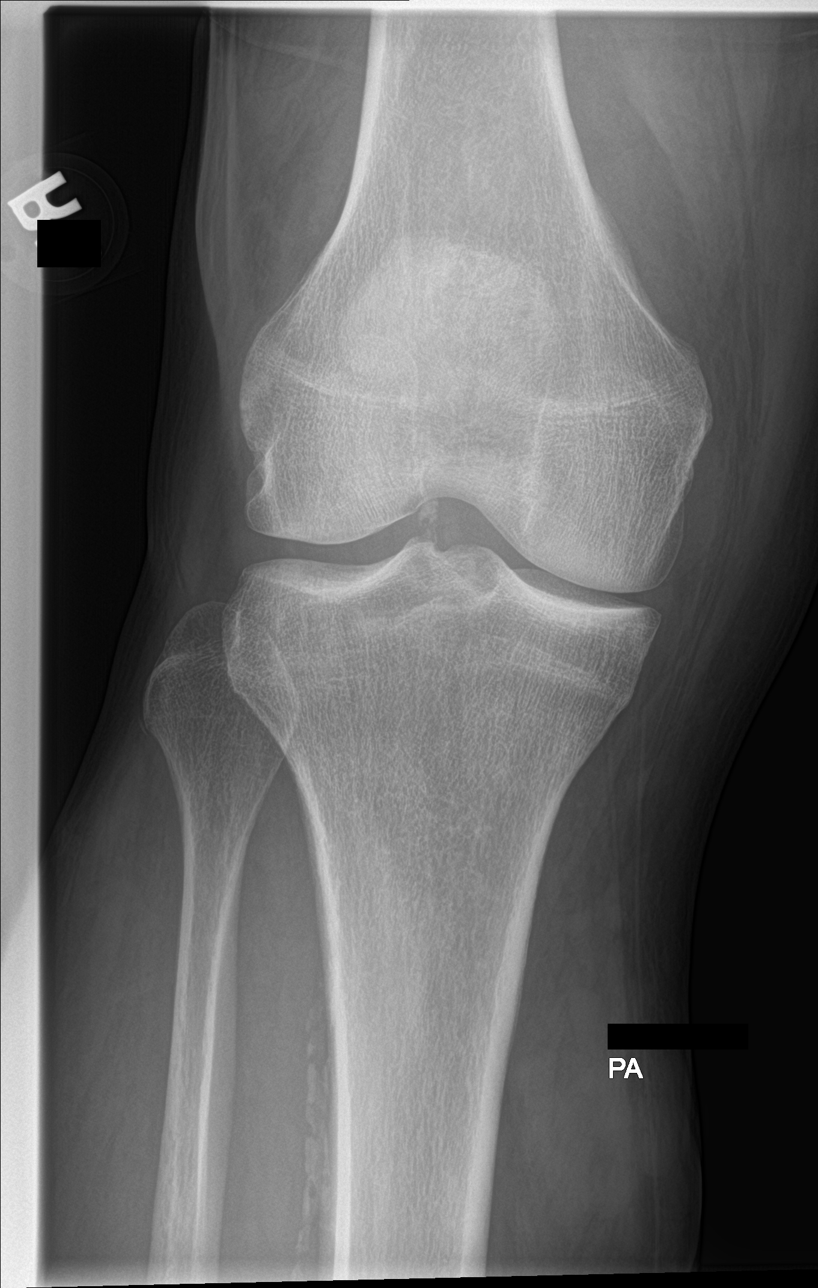

[knee lat]
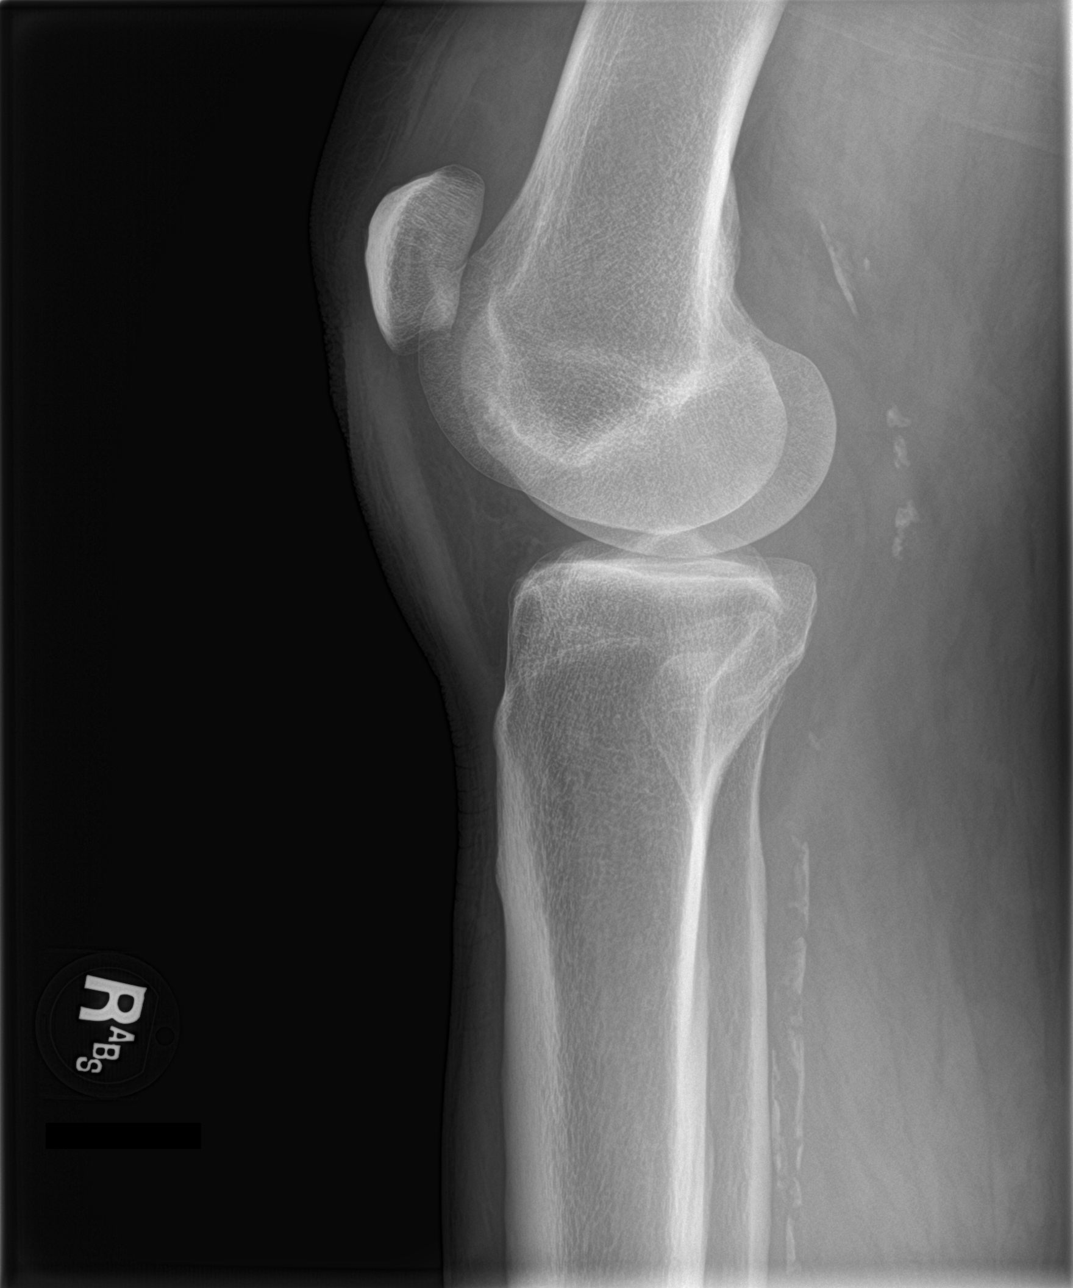

[sunrise]
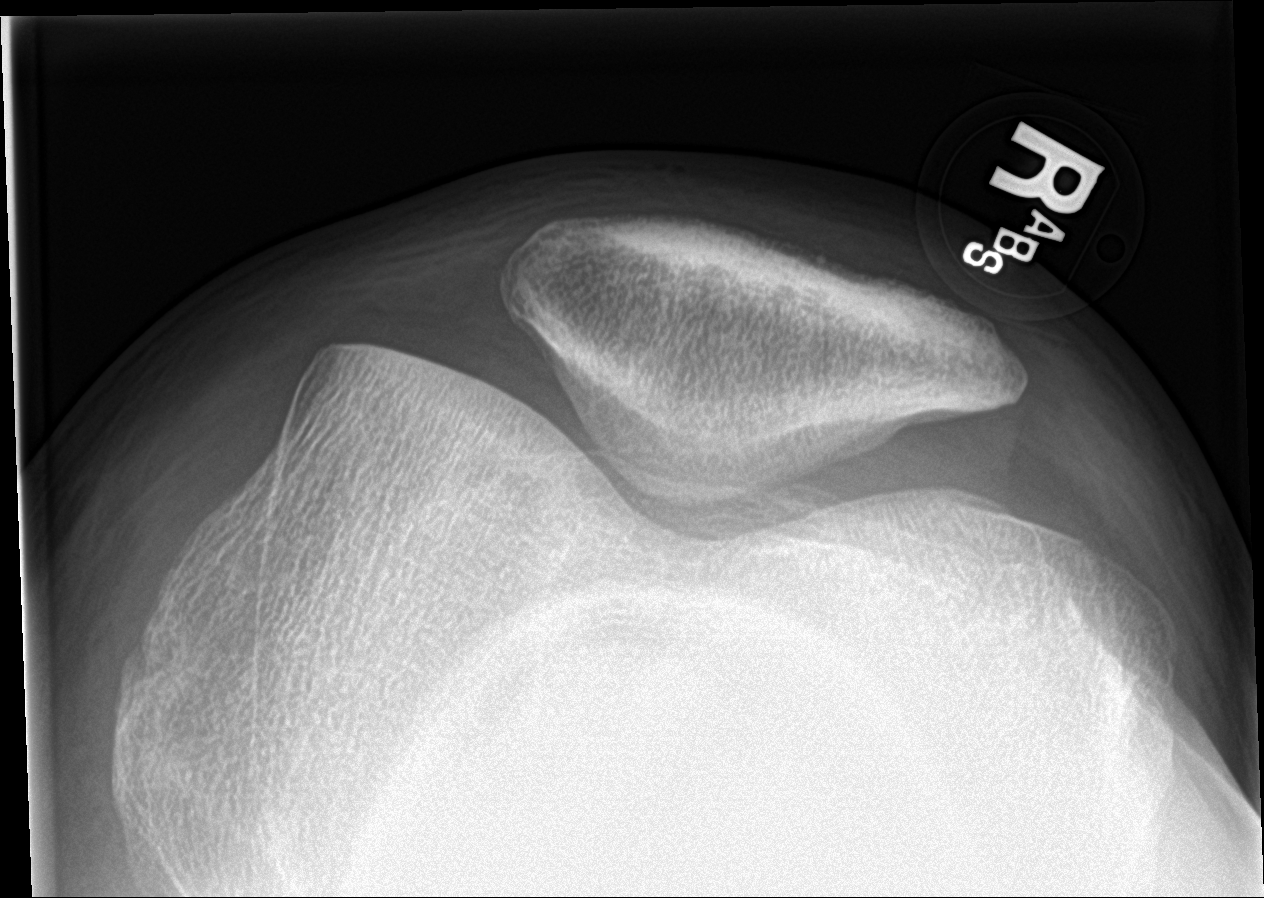

[4 of 4 positions shown; findings below may reference images not displayed]

FINDINGS: No evidence of fracture, dislocation, or joint effusion. No evidence
of arthropathy or other focal bone abnormality. Soft tissues are
unremarkable.
IMPRESSION: Negative.

## 2019-06-08 ENCOUNTER — Other Ambulatory Visit: Payer: Self-pay

## 2019-06-08 ENCOUNTER — Encounter: Payer: Self-pay | Admitting: Emergency Medicine

## 2019-06-08 ENCOUNTER — Emergency Department
Admission: EM | Admit: 2019-06-08 | Discharge: 2019-06-08 | Disposition: A | Discharge status: Home / Self Care | Payer: BC Managed Care – PPO | Attending: Emergency Medicine | Admitting: Emergency Medicine

## 2019-06-08 DIAGNOSIS — R42 Dizziness and giddiness: Secondary | ICD-10-CM | POA: Diagnosis not present

## 2019-06-08 DIAGNOSIS — N390 Urinary tract infection, site not specified: Secondary | ICD-10-CM | POA: Insufficient documentation

## 2019-06-08 DIAGNOSIS — F17200 Nicotine dependence, unspecified, uncomplicated: Secondary | ICD-10-CM | POA: Insufficient documentation

## 2019-06-08 LAB — CBC
HCT: 34.2 % — ABNORMAL LOW (ref 39.0–52.0)
Hemoglobin: 11.4 g/dL — ABNORMAL LOW (ref 13.0–17.0)
MCH: 29.9 pg (ref 26.0–34.0)
MCHC: 33.3 g/dL (ref 30.0–36.0)
MCV: 89.8 fL (ref 80.0–100.0)
Platelets: 160 10*3/uL (ref 150–400)
RBC: 3.81 MIL/uL — ABNORMAL LOW (ref 4.22–5.81)
RDW: 17.3 % — ABNORMAL HIGH (ref 11.5–15.5)
WBC: 5.2 10*3/uL (ref 4.0–10.5)
nRBC: 0 % (ref 0.0–0.2)

## 2019-06-08 LAB — COMPREHENSIVE METABOLIC PANEL
ALT: 23 U/L (ref 0–44)
AST: 36 U/L (ref 15–41)
Albumin: 4.2 g/dL (ref 3.5–5.0)
Alkaline Phosphatase: 55 U/L (ref 38–126)
Anion gap: 15 (ref 5–15)
BUN: 14 mg/dL (ref 6–20)
CO2: 22 mmol/L (ref 22–32)
Calcium: 9.1 mg/dL (ref 8.9–10.3)
Chloride: 96 mmol/L — ABNORMAL LOW (ref 98–111)
Creatinine, Ser: 0.68 mg/dL (ref 0.61–1.24)
GFR calc Af Amer: >60 mL/min (ref >60)
GFR calc non Af Amer: >60 mL/min (ref >60)
Glucose, Bld: 74 mg/dL (ref 70–99)
Potassium: 4.5 mmol/L (ref 3.5–5.1)
Sodium: 133 mmol/L — ABNORMAL LOW (ref 135–145)
Total Bilirubin: 1.2 mg/dL (ref 0.3–1.2)
Total Protein: 7.7 g/dL (ref 6.5–8.1)

## 2019-06-08 LAB — URINALYSIS, COMPLETE (UACMP) WITH MICROSCOPIC
Bacteria, UA: NONE SEEN
Bilirubin Urine: NEGATIVE
Glucose, UA: NEGATIVE mg/dL
Ketones, ur: 80 mg/dL — AB
Nitrite: NEGATIVE
Protein, ur: 30 mg/dL — AB
Specific Gravity, Urine: 1.023 (ref 1.005–1.030)
WBC, UA: >50 WBC/hpf — ABNORMAL HIGH (ref 0–5)
pH: 6 (ref 5.0–8.0)

## 2019-06-08 LAB — LIPASE, BLOOD: Lipase: 23 U/L (ref 11–51)

## 2019-06-08 MED ORDER — CEPHALEXIN 500 MG PO CAPS
500.0000 mg | ORAL_CAPSULE | Freq: Two times a day (BID) | ORAL | 0 refills | Status: AC
Start: 2019-06-08 — End: 2019-06-15

## 2019-06-08 MED ORDER — SODIUM CHLORIDE 0.9% FLUSH: 3.0000 mL | Freq: Once | INTRAVENOUS | Status: DC

## 2019-06-08 MED ORDER — CEPHALEXIN 500 MG PO CAPS
500.0000 mg | ORAL_CAPSULE | Freq: Once | ORAL | Status: AC
  Administered 2019-06-08: 22:00:00 500 mg via ORAL
  Filled 2019-06-08: qty 1

## 2019-06-08 NOTE — ED Notes (Signed)
Pt states that he does not take blood pressure medication but thinks his hypertension is due to him being hungry and ready to leave. Pt educated on stress and how it effects blood pressure and then need to return if he feels that his blood pressure is still elevated.

## 2019-06-08 NOTE — ED Triage Notes (Signed)
States while at work this morning, felt dizzy and vomited x 1.  States vomited at around 0930 this morning.  Feels generally better now, but wishes to get checked out.  AAOx3.  Skin warm and dry.  NAD

## 2019-06-08 NOTE — ED Provider Notes (Signed)
[facility] Emergency Department Provider Note ____________________________________________   First MD Initiated Contact with Patient 06/08/19 2001     (approximate)  I have reviewed the triage vital signs and the nursing notes.   HISTORY  Chief Complaint Dizziness and Emesis    HPI Shawn Mooney is a 63 y.o. male with no significant past medical history and not currently on any medications who presents with dizziness, acute onset earlier this morning when he was at work, described as lightheadedness, and associated with an episode of vomiting.  The patient states that the dizziness has resolved.  He has had some lower abdominal discomfort since the vomiting but has no significant pain.  He denies fever, headache, chest pain, shortness of breath, or diarrhea.   History reviewed. No pertinent past medical history.  There are no active problems to display for this patient.   History reviewed. No pertinent surgical history.  Prior to Admission medications   Medication Sig Start Date End Date Taking? Authorizing Provider  cephALEXin (KEFLEX) 500 MG capsule Take 1 capsule (500 mg total) by mouth 2 (two) times daily for 7 days. 06/08/19 06/15/19  Dionne Bucy, MD  cyclobenzaprine (FLEXERIL) 5 MG tablet Take 1 tablet (5 mg total) by mouth every 8 (eight) hours as needed for muscle spasms. 01/09/16   Beers, Charmayne Sheer, PA-C    Allergies Patient has no known allergies.  No family history on file.  Social History Social History   Tobacco Use  . Smoking status: Current Every Day Smoker  . Smokeless tobacco: Never Used  Substance Use Topics  . Alcohol use: Yes  . Drug use: No    Review of Systems  Constitutional: No fever/chills. Eyes: No visual changes. ENT: No sore throat. Cardiovascular: Denies chest pain. Respiratory: Denies shortness of breath. Gastrointestinal: Positive for resolved vomiting.  No diarrhea.  Genitourinary: Negative for  dysuria.  Musculoskeletal: Negative for back pain. Skin: Negative for rash. Neurological: Negative for headache.   ____________________________________________   PHYSICAL EXAM:  VITAL SIGNS: ED Triage Vitals  Enc Vitals Group     BP 06/08/19 1445 (!) 167/97     Pulse Rate 06/08/19 1445 88     Resp 06/08/19 1445 16     Temp 06/08/19 1445 98.9 F (37.2 C)     Temp Source 06/08/19 1445 Oral     SpO2 06/08/19 1445 97 %     Weight 06/08/19 1443 215 lb (97.5 kg)     Height 06/08/19 1443 6\' 2"  (1.88 m)     Head Circumference --      Peak Flow --      Pain Score 06/08/19 1443 0     Pain Loc --      Pain Edu? --      Excl. in GC? --     Constitutional: Alert and oriented. Well appearing and in no acute distress. Eyes: Conjunctivae are normal.  Head: Atraumatic. Nose: No congestion/rhinnorhea. Mouth/Throat: Mucous membranes are moist.   Neck: Normal range of motion.  Cardiovascular: Normal rate, regular rhythm. Grossly normal heart sounds.  Good peripheral circulation. Respiratory: Normal respiratory effort.  No retractions. Lungs CTAB. Gastrointestinal: Soft and nontender. No distention.  Genitourinary: No flank tenderness. Musculoskeletal: No lower extremity edema.  Extremities warm and well perfused.  Neurologic:  Normal speech and language.  Motor intact in all extremities.  Cranial nerves grossly intact.  Motor and sensory intact in all extremities.  Normal coordination and gait.  Skin:  Skin is  warm and dry. No rash noted. Psychiatric: Mood and affect are normal. Speech and behavior are normal.  ____________________________________________   LABS (all labs ordered are listed, but only abnormal results are displayed)  Labs Reviewed  COMPREHENSIVE METABOLIC PANEL - Abnormal; Notable for the following components:      Result Value   Sodium 133 (*)    Chloride 96 (*)    All other components within normal limits  CBC - Abnormal; Notable for the following components:    RBC 3.81 (*)    Hemoglobin 11.4 (*)    HCT 34.2 (*)    RDW 17.3 (*)    All other components within normal limits  URINALYSIS, COMPLETE (UACMP) WITH MICROSCOPIC - Abnormal; Notable for the following components:   Color, Urine YELLOW (*)    APPearance HAZY (*)    Hgb urine dipstick SMALL (*)    Ketones, ur 80 (*)    Protein, ur 30 (*)    Leukocytes,Ua MODERATE (*)    WBC, UA >50 (*)    All other components within normal limits  LIPASE, BLOOD   ____________________________________________  EKG  ED ECG REPORT I, Arta Silence, the attending physician, personally viewed and interpreted this ECG.  Date: 06/08/2019 EKG Time: 1452 Rate: 85 Rhythm: normal sinus rhythm QRS Axis: normal Intervals: normal ST/T Wave abnormalities: normal Narrative Interpretation: no evidence of acute ischemia  ____________________________________________  RADIOLOGY    ____________________________________________   PROCEDURES  Procedure(s) performed: No  Procedures  Critical Care performed: No ____________________________________________   INITIAL IMPRESSION / ASSESSMENT AND PLAN / ED COURSE  Pertinent labs & imaging results that were available during my care of the patient were reviewed by me and considered in my medical decision making (see chart for details).  63 year old male with no significant past medical history and not currently on any medications presents with an episode of lightheadedness earlier today associated with vomiting which has subsequently resolved.  The patient states that he feels fine now except for some lower abdominal discomfort that has been present ever since the vomiting, but he states that he thinks he is just hungry.  I reviewed the past medical records in [place].  The patient has a few prior ED visits over the last 2 years mostly for unrelated musculoskeletal type complaints.   On exam he is well-appearing.  His vital signs are normal except for  hypertension.  The remainder of the exam is unremarkable.  Abdomen is soft and nontender.  His EKG is nonischemic.  Overall presentation is consistent with vasovagal near syncope, possible gastritis or gastroenteritis, or other benign etiology.  Lab work-up obtained from triage is unremarkable.  Given the suprapubic area discomfort I will add on a urinalysis.  If this is negative anticipate discharge home.  ----------------------------------------- 9:42 PM on 06/08/2019 -----------------------------------------  UA is consistent with a UTI which could certainly explain the patient's symptoms.  He feels comfortable and is stable for discharge home.  I counseled him on the results of the work-up.  Return precautions given, and he expresses understanding.  A course of keflex has been prescribed.  ____________________________________________   FINAL CLINICAL IMPRESSION(S) / ED DIAGNOSES  Final diagnoses:  Lightheadedness  Urinary tract infection without hematuria, site unspecified      NEW MEDICATIONS STARTED DURING THIS VISIT:  New Prescriptions   CEPHALEXIN (KEFLEX) 500 MG CAPSULE    Take 1 capsule (500 mg total) by mouth 2 (two) times daily for 7 days.     Note:  This  document was prepared using Conservation officer, historic buildings and may include unintentional dictation errors.    Dionne Bucy, MD 06/08/19 [phone]

## 2019-06-08 NOTE — Discharge Instructions (Addendum)
Take the antibiotic as prescribed and finish the full 1 week course.  Return to the ER for new or worsening or persistent dizziness, weakness, abdominal pain, fever, vomiting, or any other symptoms that concern you.

## 2019-09-07 ENCOUNTER — Emergency Department: Payer: Worker's Compensation

## 2019-09-07 ENCOUNTER — Emergency Department
Admission: EM | Admit: 2019-09-07 | Discharge: 2019-09-07 | Disposition: A | Discharge status: Home / Self Care | Payer: Worker's Compensation | Attending: Emergency Medicine | Admitting: Emergency Medicine

## 2019-09-07 ENCOUNTER — Other Ambulatory Visit: Payer: Self-pay

## 2019-09-07 ENCOUNTER — Encounter: Payer: Self-pay | Admitting: Emergency Medicine

## 2019-09-07 DIAGNOSIS — S6992XA Unspecified injury of left wrist, hand and finger(s), initial encounter: Secondary | ICD-10-CM | POA: Diagnosis present

## 2019-09-07 DIAGNOSIS — S60222A Contusion of left hand, initial encounter: Secondary | ICD-10-CM | POA: Diagnosis not present

## 2019-09-07 DIAGNOSIS — Y9289 Other specified places as the place of occurrence of the external cause: Secondary | ICD-10-CM | POA: Insufficient documentation

## 2019-09-07 DIAGNOSIS — W228XXA Striking against or struck by other objects, initial encounter: Secondary | ICD-10-CM | POA: Insufficient documentation

## 2019-09-07 DIAGNOSIS — Y990 Civilian activity done for income or pay: Secondary | ICD-10-CM | POA: Insufficient documentation

## 2019-09-07 DIAGNOSIS — Y9389 Activity, other specified: Secondary | ICD-10-CM | POA: Diagnosis not present

## 2019-09-07 DIAGNOSIS — F17200 Nicotine dependence, unspecified, uncomplicated: Secondary | ICD-10-CM | POA: Diagnosis not present

## 2019-09-07 MED ORDER — NAPROXEN 500 MG PO TABS: 500.0000 mg | ORAL_TABLET | Freq: Two times a day (BID) | ORAL | 0 refills | Status: DC

## 2019-09-07 NOTE — Discharge Instructions (Signed)
Follow-up with Dr. Earnestine Leys if any continued problems with your left hand.  Ice and elevation to reduce swelling and help with pain.  Begin taking naproxen 500 mg twice daily with food.  Wear Ace wrap for protection of your hand and to give added support.

## 2019-09-07 NOTE — ED Provider Notes (Signed)
[facility] Emergency Department Provider Note   ____________________________________________   First MD Initiated Contact with Patient 09/07/19 1242     (approximate)  I have reviewed the triage vital signs and the nursing notes.   HISTORY  Chief Complaint Hand Pain   HPI Shawn Mooney is a 63 y.o. male states that his hand was hit by a beam while at work on Friday.  Patient reported this to his supervisor and states that they know he is here in the emergency department now.  Patient has been soaking his hand in Epson salt thinking it would get better.  He rates his pain as a 10/10.      History reviewed. No pertinent past medical history.  There are no active problems to display for this patient.   History reviewed. No pertinent surgical history.  Prior to Admission medications   Medication Sig Start Date End Date Taking? Authorizing Provider  naproxen (NAPROSYN) 500 MG tablet Take 1 tablet (500 mg total) by mouth 2 (two) times daily with a meal. 09/07/19   Tommi Rumps, PA-C    Allergies Patient has no known allergies.  No family history on file.  Social History Social History   Tobacco Use   Smoking status: Current Every Day Smoker   Smokeless tobacco: Never Used  Substance Use Topics   Alcohol use: Yes   Drug use: No    Review of Systems Constitutional: No fever/chills Eyes: No visual changes. Cardiovascular: Denies chest pain. Respiratory: Denies shortness of breath. Musculoskeletal: Positive left hand pain. Skin: Negative for rash. Neurological: Negative for  focal weakness or numbness. ____________________________________________   PHYSICAL EXAM:  VITAL SIGNS: ED Triage Vitals [09/07/19 1236]  Enc Vitals Group     BP (!) 168/82     Pulse Rate 86     Resp 15     Temp 98 F (36.7 C)     Temp Source Oral     SpO2 97 %     Weight 195 lb (88.5 kg)     Height 6\' 2"  (1.88 m)     Head Circumference    Peak Flow      Pain Score 10     Pain Loc      Pain Edu?      Excl. in [place]?    Constitutional: Alert and oriented. Well appearing and in no acute distress. Eyes: Conjunctivae are normal.  Head: Atraumatic. Neck: No stridor.   Cardiovascular: Normal rate, regular rhythm. Grossly normal heart sounds.  Good peripheral circulation. Respiratory: Normal respiratory effort.  No retractions. Lungs CTAB. Musculoskeletal: Examination of left hand the dorsal aspect especially over the second, third and fourth MP joints has soft tissue edema.  Range of motion of the digits is restricted secondary to pain.  Also the third digit has swelling generalized on the volar and dorsal aspect.  Patient is able to flex and extend but limited secondary to his pain.  Skin is intact.  No ecchymosis or abrasions are seen.  Capillary refills less than 3 seconds. Neurologic:  Normal speech and language. No gross focal neurologic deficits are appreciated. No gait instability. Skin:  Skin is warm, dry and intact.  Psychiatric: Mood and affect are normal. Speech and behavior are normal.  ____________________________________________   LABS (all labs ordered are listed, but only abnormal results are displayed)  Labs Reviewed - No data to display  RADIOLOGY  Official radiology report(s): Dg Hand Complete Left  Result Date: 09/07/2019  CLINICAL DATA:  The patient suffered a blow to the left hand on a beam 4 days ago. Pain since the incident. Initial encounter. EXAM: LEFT HAND - COMPLETE 3+ VIEW COMPARISON:  None. FINDINGS: There is no acute bony or joint abnormality. 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 A milder degree of degenerative change is seen at the first and third MCP joints. There is a punctate radiopaque foreign body in the volar soft tissues of the wrist which is presumably chronic. IMPRESSION: Negative for fracture. Punctate radiopaque foreign body in the volar soft tissues of the wrist is presumably  chronic. Scattered osteoarthritis is most severe at the first [facility] joint. Electronically Signed   By: Inge Rise M.D.   On: 09/07/2019 13:28    ____________________________________________   PROCEDURES  Procedure(s) performed (including Critical Care):  Procedures Ace wrap was applied to left hand. ____________________________________________   INITIAL IMPRESSION / ASSESSMENT AND PLAN / ED COURSE  As part of my medical decision making, I reviewed the following data within the electronic MEDICAL RECORD NUMBER Notes from prior ED visits and [place] Controlled Substance Database  63 year old male presents to the ED with complaint of left hand injury on Friday.  He states he was at work and was hit by a beam.  He informed his supervisor yesterday and states that they are aware that he is in the ED now.  Patient has been soaking his hand in Epson salt and is not getting any relief from the swelling on the dorsal aspect of his hand.  X-rays were negative for acute bony injury.  There was a radiopaque foreign body noted on his x-ray on the volar aspect which is not anywhere near his injury to the dorsal aspect of his second, third and fourth MP joints.  ____________________________________________   FINAL CLINICAL IMPRESSION(S) / ED DIAGNOSES  Final diagnoses:  Contusion of left hand, initial encounter     ED Discharge Orders         Ordered    naproxen (NAPROSYN) 500 MG tablet  2 times daily with meals     09/07/19 1403           Note:  This document was prepared using Dragon voice recognition software and may include unintentional dictation errors.    Johnn Hai, PA-C 09/07/19 1416    Lavonia Drafts, MD 09/07/19 1501

## 2019-09-07 NOTE — ED Triage Notes (Signed)
Patient presents to ED via POV from home. Patient reports on Friday he smashed it at work. Positive pulses and sensation.

## 2019-09-28 ENCOUNTER — Other Ambulatory Visit: Payer: Self-pay

## 2019-09-28 ENCOUNTER — Encounter: Payer: Self-pay | Admitting: Emergency Medicine

## 2019-09-28 ENCOUNTER — Emergency Department
Admission: EM | Admit: 2019-09-28 | Discharge: 2019-09-28 | Disposition: A | Discharge status: Home / Self Care | Payer: BC Managed Care – PPO | Attending: Emergency Medicine | Admitting: Emergency Medicine

## 2019-09-28 DIAGNOSIS — X58XXXD Exposure to other specified factors, subsequent encounter: Secondary | ICD-10-CM | POA: Insufficient documentation

## 2019-09-28 DIAGNOSIS — F17200 Nicotine dependence, unspecified, uncomplicated: Secondary | ICD-10-CM | POA: Insufficient documentation

## 2019-09-28 DIAGNOSIS — S6992XD Unspecified injury of left wrist, hand and finger(s), subsequent encounter: Secondary | ICD-10-CM | POA: Insufficient documentation

## 2019-09-28 DIAGNOSIS — Z0279 Encounter for issue of other medical certificate: Secondary | ICD-10-CM | POA: Insufficient documentation

## 2019-09-28 NOTE — ED Notes (Signed)
See triage note  Presents to have finger rechecked   States he has been going to work but on light duty  Denies any pain

## 2019-09-28 NOTE — ED Provider Notes (Signed)
[facility] Emergency Department Provider Note  ____________________________________________  Time seen: Approximately 4:50 PM  I have reviewed the triage vital signs and the nursing notes.   HISTORY  Chief Complaint Medical Clearance    HPI Shawn Mooney is a 63 y.o. male presents to emergency department requesting return to work.  Patient states that he was evaluated in the emergency department about 1 month ago. He was given a referral to ortho but never followed up because he has not been having any pain. He has been working normally but was just asked by his job for a note to return to work without restrictions.   History reviewed. No pertinent past medical history.  There are no active problems to display for this patient.   History reviewed. No pertinent surgical history.  Prior to Admission medications   Medication Sig Start Date End Date Taking? Authorizing Provider  naproxen (NAPROSYN) 500 MG tablet Take 1 tablet (500 mg total) by mouth 2 (two) times daily with a meal. 09/07/19   Johnn Hai, PA-C    Allergies Patient has no known allergies.  No family history on file.  Social History Social History   Tobacco Use  . Smoking status: Current Every Day Smoker  . Smokeless tobacco: Never Used  Substance Use Topics  . Alcohol use: Yes  . Drug use: No     Review of Systems  Constitutional: No fever/chills Respiratory: No SOB. Gastrointestinal: No abdominal pain.  No nausea, no vomiting. Musculoskeletal: Negative for musculoskeletal pain. No finger pain. Skin: Negative for rash, abrasions, lacerations, ecchymosis. Neurological: Negative for numbness or tingling   ____________________________________________   PHYSICAL EXAM:  VITAL SIGNS: ED Triage Vitals  Enc Vitals Group     BP 09/28/19 1409 140/87     Pulse Rate 09/28/19 1409 88     Resp 09/28/19 1409 16     Temp 09/28/19 1409 98.6 F (37 C)     Temp Source  09/28/19 1409 Oral     SpO2 09/28/19 1409 98 %     Weight 09/28/19 1359 190 lb (86.2 kg)     Height 09/28/19 1359 6\' 2"  (1.88 m)     Head Circumference --      Peak Flow --      Pain Score 09/28/19 1359 0     Pain Loc --      Pain Edu? --      Excl. in [place]? --      Constitutional: Alert and oriented. Well appearing and in no acute distress. Eyes: Conjunctivae are normal. PERRL. EOMI. Head: Atraumatic. ENT:      Ears:      Nose: No congestion/rhinnorhea.      Mouth/Throat: Mucous membranes are moist.  Neck: No stridor.  Cardiovascular: Normal rate, regular rhythm.  Good peripheral circulation. Respiratory: Normal respiratory effort without tachypnea or retractions. Lungs CTAB. Good air entry to the bases with no decreased or absent breath sounds. Musculoskeletal: Full range of motion to all extremities. No gross deformities appreciated.  Full range of motion of finger without pain.  No swelling.  No tenderness to palpation. Neurologic:  Normal speech and language. No gross focal neurologic deficits are appreciated.  Skin:  Skin is warm, dry and intact. No rash noted. Psychiatric: Mood and affect are normal. Speech and behavior are normal. Patient exhibits appropriate insight and judgement.   ____________________________________________   LABS (all labs ordered are listed, but only abnormal results are displayed)  Labs Reviewed -  No data to display ____________________________________________  EKG   ____________________________________________  RADIOLOGY   No results found.  ____________________________________________    PROCEDURES  Procedure(s) performed:    Procedures    Medications - No data to display   ____________________________________________   INITIAL IMPRESSION / ASSESSMENT AND PLAN / ED COURSE  Pertinent labs & imaging results that were available during my care of the patient were reviewed by me and considered in my medical decision making  (see chart for details).  Review of the [place] CSRS was performed in accordance of the [employer] prior to dispensing any controlled drugs.   Patient presented to emergency department for a return to work note.  Vital signs and exam are reassuring.  Note was given without restrictions.  Patient will follow up with orthopedic if he has any developing symptoms.  Patient is to follow up with Ortho as directed. Patient is given ED precautions to return to the ED for any worsening or new symptoms.   Shawn Mooney was evaluated in Emergency Department on 09/28/2019 for the symptoms described in the history of present illness. He was evaluated in the context of the global COVID-19 pandemic, which necessitated consideration that the patient might be at risk for infection with the SARS-CoV-2 virus that causes COVID-19. Institutional protocols and algorithms that pertain to the evaluation of patients at risk for COVID-19 are in a state of rapid change based on information released by regulatory bodies including the CDC and federal and state organizations. These policies and algorithms were followed during the patient's care in the ED.  ____________________________________________  FINAL CLINICAL IMPRESSION(S) / ED DIAGNOSES  Final diagnoses:  Injury of finger of left hand, subsequent encounter      NEW MEDICATIONS STARTED DURING THIS VISIT:  ED Discharge Orders    None          This chart was dictated using voice recognition software/Dragon. Despite best efforts to proofread, errors can occur which can change the meaning. Any change was purely unintentional.    Enid Derry, PA-C 09/28/19 1700    Dionne Bucy, MD 09/29/19 [id number]

## 2019-09-28 NOTE — ED Triage Notes (Signed)
Pt seen in the ED for a finger injury and his employer told him he needed clearance back to work. Pt went to [facility] who advised him to come to the ED. Pt with letter from employer regarding the length of time he missed work. Pt was advised to return to work on 10/7 and he failed to return until the 8th. Pt needs a note to cover the extra day.

## 2019-09-28 NOTE — Discharge Instructions (Signed)
Please follow-up with orthopedics if you develop any pain in his finger again.

## 2019-11-10 ENCOUNTER — Inpatient Hospital Stay
Admission: EM | Admit: 2019-11-10 | Discharge: 2019-11-10 | DRG: 177 | Discharge status: Against Medical Advice | Payer: BC Managed Care – PPO | Attending: Internal Medicine | Admitting: Internal Medicine

## 2019-11-10 ENCOUNTER — Encounter: Payer: Self-pay | Admitting: Emergency Medicine

## 2019-11-10 ENCOUNTER — Emergency Department: Payer: BC Managed Care – PPO

## 2019-11-10 DIAGNOSIS — Z79899 Other long term (current) drug therapy: Secondary | ICD-10-CM | POA: Diagnosis not present

## 2019-11-10 DIAGNOSIS — F1010 Alcohol abuse, uncomplicated: Secondary | ICD-10-CM | POA: Diagnosis present

## 2019-11-10 DIAGNOSIS — J1289 Other viral pneumonia: Secondary | ICD-10-CM | POA: Diagnosis present

## 2019-11-10 DIAGNOSIS — E860 Dehydration: Secondary | ICD-10-CM | POA: Diagnosis present

## 2019-11-10 DIAGNOSIS — M069 Rheumatoid arthritis, unspecified: Secondary | ICD-10-CM | POA: Diagnosis present

## 2019-11-10 DIAGNOSIS — E872 Acidosis, unspecified: Secondary | ICD-10-CM

## 2019-11-10 DIAGNOSIS — Y902 Blood alcohol level of 40-59 mg/100 ml: Secondary | ICD-10-CM | POA: Diagnosis present

## 2019-11-10 DIAGNOSIS — F17210 Nicotine dependence, cigarettes, uncomplicated: Secondary | ICD-10-CM | POA: Diagnosis present

## 2019-11-10 DIAGNOSIS — J1282 Pneumonia due to coronavirus disease 2019: Secondary | ICD-10-CM | POA: Diagnosis present

## 2019-11-10 DIAGNOSIS — U071 COVID-19: Principal | ICD-10-CM | POA: Diagnosis present

## 2019-11-10 DIAGNOSIS — R0602 Shortness of breath: Secondary | ICD-10-CM | POA: Diagnosis present

## 2019-11-10 HISTORY — DX: Unspecified osteoarthritis, unspecified site: M19.90

## 2019-11-10 LAB — CBC WITH DIFFERENTIAL/PLATELET
Abs Immature Granulocytes: 0.02 10*3/uL (ref 0.00–0.07)
Basophils Absolute: 0 10*3/uL (ref 0.0–0.1)
Basophils Relative: 0 %
Eosinophils Absolute: 0 10*3/uL (ref 0.0–0.5)
Eosinophils Relative: 0 %
HCT: 37 % — ABNORMAL LOW (ref 39.0–52.0)
Hemoglobin: 12.1 g/dL — ABNORMAL LOW (ref 13.0–17.0)
Immature Granulocytes: 0 %
Lymphocytes Relative: 20 %
Lymphs Abs: 2 10*3/uL (ref 0.7–4.0)
MCH: 31.3 pg (ref 26.0–34.0)
MCHC: 32.7 g/dL (ref 30.0–36.0)
MCV: 95.6 fL (ref 80.0–100.0)
Monocytes Absolute: 0.3 10*3/uL (ref 0.1–1.0)
Monocytes Relative: 3 %
Neutro Abs: 7.7 10*3/uL (ref 1.7–7.7)
Neutrophils Relative %: 77 %
Platelets: 280 10*3/uL (ref 150–400)
RBC: 3.87 MIL/uL — ABNORMAL LOW (ref 4.22–5.81)
RDW: 15.1 % (ref 11.5–15.5)
WBC: 10 10*3/uL (ref 4.0–10.5)
nRBC: 0 % (ref 0.0–0.2)

## 2019-11-10 LAB — TROPONIN I (HIGH SENSITIVITY)
Troponin I (High Sensitivity): 5 ng/L (ref <18)
Troponin I (High Sensitivity): 5 ng/L (ref <18)

## 2019-11-10 LAB — GLUCOSE, CAPILLARY: Glucose-Capillary: 129 mg/dL — ABNORMAL HIGH (ref 70–99)

## 2019-11-10 LAB — LACTIC ACID, PLASMA
Lactic Acid, Venous: 5.5 mmol/L (ref 0.5–1.9)
Lactic Acid, Venous: 2.4 mmol/L (ref 0.5–1.9)

## 2019-11-10 LAB — COMPREHENSIVE METABOLIC PANEL
ALT: 40 U/L (ref 0–44)
AST: 80 U/L — ABNORMAL HIGH (ref 15–41)
Albumin: 4 g/dL (ref 3.5–5.0)
Alkaline Phosphatase: 45 U/L (ref 38–126)
Anion gap: 22 — ABNORMAL HIGH (ref 5–15)
BUN: 20 mg/dL (ref 8–23)
CO2: 16 mmol/L — ABNORMAL LOW (ref 22–32)
Calcium: 8.6 mg/dL — ABNORMAL LOW (ref 8.9–10.3)
Chloride: 100 mmol/L (ref 98–111)
Creatinine, Ser: 0.89 mg/dL (ref 0.61–1.24)
GFR calc Af Amer: >60 mL/min (ref >60)
GFR calc non Af Amer: >60 mL/min (ref >60)
Glucose, Bld: 125 mg/dL — ABNORMAL HIGH (ref 70–99)
Potassium: 3.3 mmol/L — ABNORMAL LOW (ref 3.5–5.1)
Sodium: 138 mmol/L (ref 135–145)
Total Bilirubin: 0.7 mg/dL (ref 0.3–1.2)
Total Protein: 7.3 g/dL (ref 6.5–8.1)

## 2019-11-10 LAB — POC SARS CORONAVIRUS 2 AG: SARS Coronavirus 2 Ag: POSITIVE — AB

## 2019-11-10 LAB — ETHANOL: Alcohol, Ethyl (B): 47 mg/dL — ABNORMAL HIGH (ref <10)

## 2019-11-10 LAB — BRAIN NATRIURETIC PEPTIDE: B Natriuretic Peptide: 30 pg/mL (ref 0.0–100.0)

## 2019-11-10 LAB — FIBRIN DERIVATIVES D-DIMER (ARMC ONLY): Fibrin derivatives D-dimer (ARMC): 685.78 ng/mL (FEU) — ABNORMAL HIGH (ref 0.00–499.00)

## 2019-11-10 MED ORDER — SODIUM CHLORIDE 0.9 % IV SOLN
200.0000 mg | Freq: Once | INTRAVENOUS | Status: DC
  Filled 2019-11-10: qty 40

## 2019-11-10 MED ORDER — ONDANSETRON HCL 4 MG PO TABS
4.0000 mg | ORAL_TABLET | Freq: Four times a day (QID) | ORAL | Status: DC | PRN
  Filled 2019-11-10: qty 1

## 2019-11-10 MED ORDER — HYDROCOD POLST-CPM POLST ER 10-8 MG/5ML PO SUER: 5.0000 mL | Freq: Two times a day (BID) | ORAL | Status: DC | PRN

## 2019-11-10 MED ORDER — FOLIC ACID 1 MG PO TABS: 1.0000 mg | ORAL_TABLET | Freq: Every day | ORAL | Status: DC

## 2019-11-10 MED ORDER — LORAZEPAM 2 MG/ML IJ SOLN
1.0000 mg | INTRAMUSCULAR | Status: DC | PRN
  Filled 2019-11-10: qty 2

## 2019-11-10 MED ORDER — SODIUM CHLORIDE 0.9 % IV BOLUS
1000.0000 mL | Freq: Once | INTRAVENOUS | Status: AC
  Administered 2019-11-10: 1000 mL via INTRAVENOUS

## 2019-11-10 MED ORDER — IOHEXOL 350 MG/ML SOLN
75.0000 mL | Freq: Once | INTRAVENOUS | Status: AC | PRN
  Administered 2019-11-10: 75 mL via INTRAVENOUS

## 2019-11-10 MED ORDER — GUAIFENESIN-DM 100-10 MG/5ML PO SYRP
10.0000 mL | ORAL_SOLUTION | ORAL | Status: DC | PRN
  Filled 2019-11-10: qty 10

## 2019-11-10 MED ORDER — LEVOFLOXACIN 750 MG PO TABS: 750.0000 mg | ORAL_TABLET | Freq: Every day | ORAL | 0 refills | Status: AC

## 2019-11-10 MED ORDER — ADULT MULTIVITAMIN W/MINERALS CH: 1.0000 | ORAL_TABLET | Freq: Every day | ORAL | Status: DC

## 2019-11-10 MED ORDER — SODIUM CHLORIDE 0.9 % IV SOLN: 100.0000 mg | Freq: Every day | INTRAVENOUS | Status: DC

## 2019-11-10 MED ORDER — LORAZEPAM 1 MG PO TABS
1.0000 mg | ORAL_TABLET | ORAL | Status: DC | PRN
  Filled 2019-11-10: qty 4

## 2019-11-10 MED ORDER — SODIUM CHLORIDE 0.9 % IV SOLN
100.0000 mg | Freq: Every day | INTRAVENOUS | Status: DC
  Filled 2019-11-10: qty 20

## 2019-11-10 MED ORDER — ZINC SULFATE 220 (50 ZN) MG PO CAPS
220.0000 mg | ORAL_CAPSULE | Freq: Every day | ORAL | Status: DC
  Filled 2019-11-10: qty 1

## 2019-11-10 MED ORDER — ACETAMINOPHEN 325 MG PO TABS: 650.0000 mg | ORAL_TABLET | Freq: Four times a day (QID) | ORAL | Status: DC | PRN

## 2019-11-10 MED ORDER — THIAMINE HCL 100 MG/ML IJ SOLN: 100.0000 mg | Freq: Every day | INTRAMUSCULAR | Status: DC

## 2019-11-10 MED ORDER — VITAMIN C 500 MG PO TABS
500.0000 mg | ORAL_TABLET | Freq: Every day | ORAL | Status: DC
  Filled 2019-11-10: qty 1

## 2019-11-10 MED ORDER — VITAMIN B-1 100 MG PO TABS: 100.0000 mg | ORAL_TABLET | Freq: Every day | ORAL | Status: DC

## 2019-11-10 MED ORDER — ENOXAPARIN SODIUM 40 MG/0.4ML ~~LOC~~ SOLN: 40.0000 mg | SUBCUTANEOUS | Status: DC

## 2019-11-10 MED ORDER — SODIUM CHLORIDE 0.9 % IV SOLN: INTRAVENOUS | Status: DC

## 2019-11-10 MED ORDER — SODIUM CHLORIDE 0.9 % IV SOLN
500.0000 mg | Freq: Once | INTRAVENOUS | Status: DC
  Filled 2019-11-10: qty 500

## 2019-11-10 MED ORDER — SODIUM CHLORIDE 0.9 % IV SOLN: 200.0000 mg | Freq: Once | INTRAVENOUS | Status: DC

## 2019-11-10 MED ORDER — ONDANSETRON HCL 4 MG/2ML IJ SOLN
4.0000 mg | Freq: Four times a day (QID) | INTRAMUSCULAR | Status: DC | PRN
  Filled 2019-11-10: qty 2

## 2019-11-10 MED ORDER — METHYLPREDNISOLONE SODIUM SUCC 125 MG IJ SOLR: 0.5000 mg/kg | Freq: Two times a day (BID) | INTRAMUSCULAR | Status: DC

## 2019-11-10 NOTE — ED Provider Notes (Signed)
[facility] Emergency Department Provider Note  ____________________________________________   First MD Initiated Contact with Patient 11/10/19 0815     (approximate)  I have reviewed the triage vital signs and the nursing notes.   HISTORY  Chief Complaint Shortness of Breath    HPI Shawn Mooney is a 63 y.o. male  Here with SOB, lightheadedness, weakness. Pt reports that he awoke this AM "feeling fine." He went to the bathroom and upon returning to his room, experienced acute onset of severe SOB, left upper chest pain, and lightheadedness. He felt like he was going to pass out. He has since had ongoing SOB along with an aching, left upper CP. No fever. He denies any known sick contacts. No h/o same. No abd pain. No focal numbness or weakness.  No alleviating factors. No h/o DVT/PE.  Per review of records, pt has h/o RA followed at [facility] on MTX, prednisone, and infusions.        Past Medical History:  Diagnosis Date  . Arthritis     Patient Active Problem List   Diagnosis Date Noted  . Pneumonia due to COVID-19 virus 11/10/2019    History reviewed. No pertinent surgical history.  Prior to Admission medications   Medication Sig Start Date End Date Taking? Authorizing Provider  folic acid (FOLVITE) 1 MG tablet Take 1 mg by mouth daily. 10/05/19 10/04/20 Yes [provider]  methotrexate 2.5 MG tablet Take 20 mg by mouth once a week. 11/03/19  Yes [provider]  levofloxacin (LEVAQUIN) 750 MG tablet Take 1 tablet (750 mg total) by mouth daily for 5 days. 11/10/19 11/15/19  Shaune Pollack, MD    Allergies Patient has no known allergies.  History reviewed. No pertinent family history.  Social History Social History   Tobacco Use  . Smoking status: Current Every Day Smoker  . Smokeless tobacco: Never Used  Substance Use Topics  . Alcohol use: Yes  . Drug use: No    Review of Systems  Review of Systems   Constitutional: Positive for fatigue. Negative for chills and fever.  HENT: Negative for sore throat.   Respiratory: Positive for chest tightness and shortness of breath.   Cardiovascular: Positive for chest pain.  Gastrointestinal: Negative for abdominal pain.  Genitourinary: Negative for flank pain.  Musculoskeletal: Negative for neck pain.  Skin: Negative for rash and wound.  Allergic/Immunologic: Negative for immunocompromised state.  Neurological: Positive for weakness and light-headedness. Negative for numbness.  Hematological: Does not bruise/bleed easily.  All other systems reviewed and are negative.    ____________________________________________  PHYSICAL EXAM:      VITAL SIGNS: ED Triage Vitals  Enc Vitals Group     BP      Pulse      Resp      Temp      Temp src      SpO2      Weight      Height      Head Circumference      Peak Flow      Pain Score      Pain Loc      Pain Edu?      Excl. in GC?      Physical Exam Vitals signs and nursing note reviewed.  Constitutional:      General: He is not in acute distress.    Appearance: He is well-developed.  HENT:     Head: Normocephalic and atraumatic.  Eyes:  Conjunctiva/sclera: Conjunctivae normal.  Neck:     Musculoskeletal: Neck supple.  Cardiovascular:     Rate and Rhythm: Regular rhythm.     Heart sounds: Normal heart sounds. No murmur. No friction rub.  Pulmonary:     Effort: Pulmonary effort is normal. No respiratory distress.     Breath sounds: Normal breath sounds. No wheezing or rales.  Chest:     Comments: Mild TTP left upper chest wall, no deformity Abdominal:     General: There is no distension.     Palpations: Abdomen is soft.     Tenderness: There is no abdominal tenderness.  Skin:    General: Skin is warm.     Capillary Refill: Capillary refill takes less than 2 seconds.  Neurological:     Mental Status: He is alert and oriented to person, place, and time.     Motor: No  abnormal muscle tone.       ____________________________________________   LABS (all labs ordered are listed, but only abnormal results are displayed)  Labs Reviewed  CBC WITH DIFFERENTIAL/PLATELET - Abnormal; Notable for the following components:      Result Value   RBC 3.87 (*)    Hemoglobin 12.1 (*)    HCT 37.0 (*)    All other components within normal limits  COMPREHENSIVE METABOLIC PANEL - Abnormal; Notable for the following components:   Potassium 3.3 (*)    CO2 16 (*)    Glucose, Bld 125 (*)    Calcium 8.6 (*)    AST 80 (*)    Anion gap 22 (*)    All other components within normal limits  GLUCOSE, CAPILLARY - Abnormal; Notable for the following components:   Glucose-Capillary 129 (*)    All other components within normal limits  FIBRIN DERIVATIVES D-DIMER ([facility] ONLY) - Abnormal; Notable for the following components:   Fibrin derivatives D-dimer ([facility]) 685.78 (*)    All other components within normal limits  ETHANOL - Abnormal; Notable for the following components:   Alcohol, Ethyl (B) 47 (*)    All other components within normal limits  LACTIC ACID, PLASMA - Abnormal; Notable for the following components:   Lactic Acid, Venous 5.5 (*)    All other components within normal limits  LACTIC ACID, PLASMA - Abnormal; Notable for the following components:   Lactic Acid, Venous 2.4 (*)    All other components within normal limits  POC SARS CORONAVIRUS 2 AG - Abnormal; Notable for the following components:   SARS Coronavirus 2 Ag POSITIVE (*)    All other components within normal limits  CULTURE, BLOOD (ROUTINE X 2)  CULTURE, BLOOD (ROUTINE X 2)  BRAIN NATRIURETIC PEPTIDE  HIV ANTIBODY (ROUTINE TESTING W REFLEX)  COMPREHENSIVE METABOLIC PANEL  MAGNESIUM  PHOSPHORUS  CBC  CBG MONITORING, ED  POC SARS CORONAVIRUS 2 AG -  ED  TROPONIN I (HIGH SENSITIVITY)  TROPONIN I (HIGH SENSITIVITY)    ____________________________________________  EKG: Normal sinus rhythm,  ventricular rate 83.  QRS 98, QTc 465.  Probable LVH, no significant change from prior.` ________________________________________  RADIOLOGY All imaging, including plain films, CT scans, and ultrasounds, independently reviewed by me, and interpretations confirmed via formal radiology reads.  ED MD interpretation:   CXR: Negative  Official radiology report(s): Dg Chest 2 View  Result Date: 11/10/2019 CLINICAL DATA:  Shortness of breath. EXAM: CHEST - 2 VIEW COMPARISON:  None. FINDINGS: The heart size and mediastinal contours are within normal limits. Both lungs are clear. No  pneumothorax or pleural effusion is noted. The visualized skeletal structures are unremarkable. IMPRESSION: No active cardiopulmonary disease. Electronically Signed   By: Lupita Raider M.D.   On: 11/10/2019 08:42   Ct Angio Chest Pe W And/or Wo Contrast  Result Date: 11/10/2019 CLINICAL DATA:  Suspected pulmonary embolism. EXAM: CT ANGIOGRAPHY CHEST WITH CONTRAST TECHNIQUE: Multidetector CT imaging of the chest was performed using the standard protocol during bolus administration of intravenous contrast. Multiplanar CT image reconstructions and MIPs were obtained to evaluate the vascular anatomy. CONTRAST:  31mL OMNIPAQUE IOHEXOL 350 MG/ML SOLN COMPARISON:  Chest x-ray same date. FINDINGS: Cardiovascular: Calcified and noncalcified atherosclerosis throughout the thoracic aorta. Heart size is normal without pericardial effusion. Signs of calcified coronary artery disease. No signs of acute pulmonary embolism. Mediastinum/Nodes: No enlarged mediastinal, hilar, or axillary lymph nodes. Thyroid gland, trachea, and esophagus demonstrate no significant findings. Lungs/Pleura: Signs of paraseptal and centrilobular emphysema worse the lung apices. Subtle areas of ground-glass opacity at the periphery, along the fissure, at the confluence of minor and major fissure on the right mid chest, along the posterior right upper lobe and along  the posterior aspect of the superior segment of the right lower lobe. Less confluent areas overlying the right hemidiaphragm. No signs of dense consolidation or evidence of pleural effusion. The airways are patent. Air in the superior segment of the right lower lobe measures 2.9 x 1.1 cm. Area along the confluence of major and minor fissure in the right chest measures 2.7 x 1.9 cm. Upper Abdomen: No signs of acute finding in the upper abdomen. Musculoskeletal: No signs of acute bone finding or evidence of destructive bone process. Review of the MIP images confirms the above findings. IMPRESSION: 1. Negative for acute pulmonary embolism. 2. Signs of paraseptal and centrilobular emphysema worse the lung apices. 3. Subtle areas of ground-glass opacity at the periphery, along the fissure, and along the posterior aspect of the superior segment of the right lower lobe. Findings could be seen in the setting of viral or atypical pneumonia. Clinical correlation suggested. Given ground glass nature without diffuse characteristics would suggest a 3 month follow-up to ensure resolution of above findings and exclude underlying lesion. 4. Signs of calcified and noncalcified atherosclerosis throughout the thoracic aorta. 5. Coronary artery disease. Aortic Atherosclerosis (ICD10-I70.0) and Emphysema (ICD10-J43.9). Electronically Signed   By: Donzetta Kohut M.D.   On: 11/10/2019 10:03    ____________________________________________  PROCEDURES   Procedure(s) performed (including Critical Care):  Procedures  ____________________________________________  INITIAL IMPRESSION / MDM / ASSESSMENT AND PLAN / ED COURSE  As part of my medical decision making, I reviewed the following data within the electronic MEDICAL RECORD NUMBER Nursing notes reviewed and incorporated, Old chart reviewed, Notes from prior ED visits, and [place] Controlled Substance Database       *PAX MONNIER was evaluated in Emergency Department on  11/10/2019 for the symptoms described in the history of present illness. He was evaluated in the context of the global COVID-19 pandemic, which necessitated consideration that the patient might be at risk for infection with the SARS-CoV-2 virus that causes COVID-19. Institutional protocols and algorithms that pertain to the evaluation of patients at risk for COVID-19 are in a state of rapid change based on information released by regulatory bodies including the CDC and federal and state organizations. These policies and algorithms were followed during the patient's care in the ED.  Some ED evaluations and interventions may be delayed as a result of limited staffing during  the pandemic.*     Medical Decision Making: 63 year old male with history of RA on immunotherapy, prednisone, methotrexate, here with multiple complaints.  Primary issue is lightheadedness, shortness of breath, and near syncopal episode upon standing.  Lab work reviewed, and shows significant dehydration with acidemia and elevated anion gap likely secondary to lactic acidosis.  Of note, his alcohol is positive and he does endorse alcohol use with elevated AST to ALT ratio.  This could represent an alcoholic ketoacidosis but in the setting of his immunosuppression, as well as atypical pneumonia on CT scan, will cover with azithromycin.  Do not feel procalcitonin would be accurate in the setting of his immunosuppression.  Otherwise, Covid is positive.  He is not hypoxic, but given his lactic acidosis, near syncopal episode, immunosuppression, will admit for monitoring and antibiotics.  ADDENDUM: Pt was admitted to hospitalist, but shortly thereafter decided to leave AMA. He is satting >95% on RA. LA downtrending. He is calm, awake, alert, but states he has to leave. Discussed risks of doing so, including worsening resp failure, cardiac arrest, death, disability, and he understands. He is also aware of the dangers of his high BP. Will start on  empiric abx as outpt, advise him to return. ____________________________________________  FINAL CLINICAL IMPRESSION(S) / ED DIAGNOSES  Final diagnoses:  COVID-19  Dehydration  Lactic acidosis     MEDICATIONS GIVEN DURING THIS VISIT:  Medications  azithromycin (ZITHROMAX) 500 mg in sodium chloride 0.9 % 250 mL IVPB (has no administration in time range)  remdesivir 200 mg in sodium chloride 0.9% 250 mL IVPB (has no administration in time range)    Followed by  remdesivir 100 mg in sodium chloride 0.9 % 100 mL IVPB (has no administration in time range)  enoxaparin (LOVENOX) injection 40 mg (has no administration in time range)  methylPREDNISolone sodium succinate (SOLU-MEDROL) 125 mg/2 mL injection 43.125 mg (has no administration in time range)  guaiFENesin-dextromethorphan (ROBITUSSIN DM) 100-10 MG/5ML syrup 10 mL (has no administration in time range)  chlorpheniramine-HYDROcodone (TUSSIONEX) 10-8 MG/5ML suspension 5 mL (has no administration in time range)  vitamin C (ASCORBIC ACID) tablet 500 mg (has no administration in time range)  zinc sulfate capsule 220 mg (has no administration in time range)  acetaminophen (TYLENOL) tablet 650 mg (has no administration in time range)  ondansetron (ZOFRAN) tablet 4 mg (has no administration in time range)    Or  ondansetron (ZOFRAN) injection 4 mg (has no administration in time range)  LORazepam (ATIVAN) tablet 1-4 mg (has no administration in time range)    Or  LORazepam (ATIVAN) injection 1-4 mg (has no administration in time range)  thiamine (VITAMIN B-1) tablet 100 mg (has no administration in time range)    Or  thiamine (B-1) injection 100 mg (has no administration in time range)  folic acid (FOLVITE) tablet 1 mg (has no administration in time range)  multivitamin with minerals tablet 1 tablet (has no administration in time range)  0.9 %  sodium chloride infusion (has no administration in time range)  sodium chloride 0.9 % bolus  1,000 mL (0 mLs Intravenous Stopped 11/10/19 1537)  iohexol (OMNIPAQUE) 350 MG/ML injection 75 mL (75 mLs Intravenous Contrast Given 11/10/19 0945)  sodium chloride 0.9 % bolus 1,000 mL (0 mLs Intravenous Stopped 11/10/19 1537)     ED Discharge Orders         Ordered    levofloxacin (LEVAQUIN) 750 MG tablet  Daily     11/10/19 1343  Note:  This document was prepared using Dragon voice recognition software and may include unintentional dictation errors.   Duffy Bruce, MD 11/10/19 [id number]

## 2019-11-10 NOTE — ED Notes (Signed)
Patient transported to CT 

## 2019-11-10 NOTE — ED Triage Notes (Signed)
Pt to ED by EMS w/ c/o of SOB and generalized body aches since waking this morning. Upon EMS arrival pt's CBG 53. Oral glucose administered and CBG decreased to 44. D10 started and approx 126ml received. Upon ED arrival pt's CBG 129.

## 2019-11-10 NOTE — ED Notes (Signed)
Pt left AMA. Pt informed that he is COVID positive and must quarantine as his work note states. Pt in NAD at time of departure and ambulatory to lobby.

## 2019-11-10 NOTE — Discharge Summary (Signed)
Discharge summary  Please see earlier H&P for full details, in brief patient presented with dyspnea, weakness, found to have Covid 19 with images suggesting pneumonia. He was recommended for admission however left AMA  Shawn Kercheval M. Cruzita Lederer, MD, PhD [facility]

## 2019-11-10 NOTE — ED Notes (Signed)
Pt refusing any further treatment. Pt states he is going home as soon as he can get a ride.

## 2019-11-10 NOTE — Progress Notes (Signed)
Remdesivir - Pharmacy Brief Note   O:  ALT: 40 CXR: no active disease SpO2: 98% on RA   A/P:  Remdesivir 200 mg IVPB once followed by 100 mg IVPB daily x 4 days.   Dorena Bodo, PharmD 11/10/2019 12:18 PM

## 2019-11-10 NOTE — ED Notes (Signed)
Pt left AMA at 15:23. This RN re-enforced that he is COVID positive and must quarantine as his work note states.

## 2019-11-10 NOTE — H&P (Signed)
History and Physical    Shawn Mooney KXF:[id number] DOB: 03-Jan-1956 DOA: 11/10/2019  I have briefly reviewed the patient's prior medical records in [place]  PCP: Patient, No Pcp Per  Patient coming from: home  Chief Complaint: Shortness of breath, weakness  HPI: Shawn Mooney is a 63 y.o. male with medical history significant of rheumatoid arthritis on chronic methotrexate, alcohol use, presents to the hospital with chief complaint of weakness and shortness of breath.  Patient has been feeling weak for the past week however upon waking up this morning he felt worse, short of breath, lightheaded and decided to come to the emergency room.  He denies any chest pain, he denies any abdominal pain, nausea, vomiting or diarrhea.  He reports poor p.o. intake over the last several days.  He reports that he smokes cigarettes as well as drinking alcohol but denies on a daily basis.  He has no lightheadedness or dizziness currently.  He received IV fluids and currently is feeling better and is not sure whether he wants to be admitted to the hospital.   ED Course: In the emergency room patient is afebrile, normotensive and satting well on room air.  His COVID-19 returned positive.  His initial blood work showed an elevated lactic acid of 5.5 which improved to 2.4 after fluids.  He had a CT angiogram on admission which showed no PE however it did show subtle areas of groundglass opacities at the periphery suggesting viral or atypical pneumonia.  He was given steroids, remdesivir and we are asked to admit  Review of Systems: All systems reviewed, and apart from HPI, all negative  Past Medical History:  Diagnosis Date  . Arthritis     History reviewed. No pertinent surgical history.   reports that he has been smoking. He has never used smokeless tobacco. He reports current alcohol use. He reports that he does not use drugs.  No Known Allergies  History reviewed. No pertinent family  history.  Prior to Admission medications   Medication Sig Start Date End Date Taking? Authorizing Provider  folic acid (FOLVITE) 1 MG tablet Take 1 mg by mouth daily. 10/05/19 10/04/20 Yes [provider]  methotrexate 2.5 MG tablet Take 20 mg by mouth once a week. 11/03/19  Yes [provider]    Physical Exam: Vitals:   11/10/19 0818 11/10/19 0841  BP: (!) 146/83   Pulse: 81   Resp: 15   Temp: 97.8 F (36.6 C)   SpO2: 98%   Weight:  86.2 kg  Height:  6\' 3"  (1.905 m)    Constitutional: NAD, calm, comfortable Eyes: PERRL, lids and conjunctivae normal ENMT: Mucous membranes are moist. Posterior pharynx clear of any exudate or lesions.Normal dentition.  Neck: normal, supple Respiratory: clear to auscultation bilaterally, no wheezing, no crackles. Normal respiratory effort. No accessory muscle use.  Cardiovascular: Regular rate and rhythm, no murmurs / rubs / gallops. No extremity edema. 2+ pedal pulses.  Abdomen: no tenderness, no masses palpated. Bowel sounds positive.  Musculoskeletal: no clubbing / cyanosis. Normal muscle tone.  Skin: no rashes, lesions, ulcers. No induration Neurologic: CN 2-12 grossly intact. Strength 5/5 in all 4.  Psychiatric: Normal judgment and insight. Alert and oriented x 3. Normal mood.   Labs on Admission: I have personally reviewed following labs and imaging studies  CBC: Recent Labs  Lab 11/10/19 0819  WBC 10.0  NEUTROABS 7.7  HGB 12.1*  HCT 37.0*  MCV 95.6  PLT 280  Basic Metabolic Panel: Recent Labs  Lab 11/10/19 0819  NA 138  K 3.3*  CL 100  CO2 16*  GLUCOSE 125*  BUN 20  CREATININE 0.89  CALCIUM 8.6*   Liver Function Tests: Recent Labs  Lab 11/10/19 0819  AST 80*  ALT 40  ALKPHOS 45  BILITOT 0.7  PROT 7.3  ALBUMIN 4.0   Coagulation Profile: No results for input(s): INR, PROTIME in the last 168 hours. BNP (last 3 results) No results for input(s): PROBNP in the last 8760 hours. CBG: Recent Labs   Lab 11/10/19 0816  GLUCAP 129*   Thyroid Function Tests: No results for input(s): TSH, T4TOTAL, FREET4, T3FREE, THYROIDAB in the last 72 hours. Urine analysis:    Component Value Date/Time   COLORURINE YELLOW (A) 06/08/2019 2037   APPEARANCEUR HAZY (A) 06/08/2019 2037   LABSPEC 1.023 06/08/2019 2037   PHURINE 6.0 06/08/2019 2037   GLUCOSEU NEGATIVE 06/08/2019 2037   HGBUR SMALL (A) 06/08/2019 2037   BILIRUBINUR NEGATIVE 06/08/2019 2037   KETONESUR 80 (A) 06/08/2019 2037   PROTEINUR 30 (A) 06/08/2019 2037   NITRITE NEGATIVE 06/08/2019 2037   LEUKOCYTESUR MODERATE (A) 06/08/2019 2037     Radiological Exams on Admission: Dg Chest 2 View  Result Date: 11/10/2019 CLINICAL DATA:  Shortness of breath. EXAM: CHEST - 2 VIEW COMPARISON:  None. FINDINGS: The heart size and mediastinal contours are within normal limits. Both lungs are clear. No pneumothorax or pleural effusion is noted. The visualized skeletal structures are unremarkable. IMPRESSION: No active cardiopulmonary disease. Electronically Signed   By: Lupita Raider M.D.   On: 11/10/2019 08:42   Ct Angio Chest Pe W And/or Wo Contrast  Result Date: 11/10/2019 CLINICAL DATA:  Suspected pulmonary embolism. EXAM: CT ANGIOGRAPHY CHEST WITH CONTRAST TECHNIQUE: Multidetector CT imaging of the chest was performed using the standard protocol during bolus administration of intravenous contrast. Multiplanar CT image reconstructions and MIPs were obtained to evaluate the vascular anatomy. CONTRAST:  47mL OMNIPAQUE IOHEXOL 350 MG/ML SOLN COMPARISON:  Chest x-ray same date. FINDINGS: Cardiovascular: Calcified and noncalcified atherosclerosis throughout the thoracic aorta. Heart size is normal without pericardial effusion. Signs of calcified coronary artery disease. No signs of acute pulmonary embolism. Mediastinum/Nodes: No enlarged mediastinal, hilar, or axillary lymph nodes. Thyroid gland, trachea, and esophagus demonstrate no significant  findings. Lungs/Pleura: Signs of paraseptal and centrilobular emphysema worse the lung apices. Subtle areas of ground-glass opacity at the periphery, along the fissure, at the confluence of minor and major fissure on the right mid chest, along the posterior right upper lobe and along the posterior aspect of the superior segment of the right lower lobe. Less confluent areas overlying the right hemidiaphragm. No signs of dense consolidation or evidence of pleural effusion. The airways are patent. Air in the superior segment of the right lower lobe measures 2.9 x 1.1 cm. Area along the confluence of major and minor fissure in the right chest measures 2.7 x 1.9 cm. Upper Abdomen: No signs of acute finding in the upper abdomen. Musculoskeletal: No signs of acute bone finding or evidence of destructive bone process. Review of the MIP images confirms the above findings. IMPRESSION: 1. Negative for acute pulmonary embolism. 2. Signs of paraseptal and centrilobular emphysema worse the lung apices. 3. Subtle areas of ground-glass opacity at the periphery, along the fissure, and along the posterior aspect of the superior segment of the right lower lobe. Findings could be seen in the setting of viral or atypical pneumonia. Clinical correlation suggested.  Given ground glass nature without diffuse characteristics would suggest a 3 month follow-up to ensure resolution of above findings and exclude underlying lesion. 4. Signs of calcified and noncalcified atherosclerosis throughout the thoracic aorta. 5. Coronary artery disease. Aortic Atherosclerosis (ICD10-I70.0) and Emphysema (ICD10-J43.9). Electronically Signed   By: Donzetta Kohut M.D.   On: 11/10/2019 10:03    EKG: Independently reviewed.  Sinus rhythm, changes consistent with LVH  Assessment/Plan  Principal Problem Generalized weakness, evidence of multifocal pneumonia on the CT scan and COVID-19 positive, shortness of breath at home -He is at risk for worsening  respiratory status, admit to the hospital, start remdesivir, steroids and supportive treatment -Incentive spirometry, flutter valve -Patient does want to leave AMA and is not 100% sure he wants to stay but he is considering it  Active Problems Alcohol abuse -Patient has elevated EtOH level at 47 this morning, last drink was last night. -Placed on CIWA  Elevated lactic acid -In the setting of anion gap metabolic acidosis, possibly due to poor p.o. intake/starvation as well as alcohol abuse -Improved after fluids, continue IV fluids and repeat lactic acid tomorrow morning  Rheumatoid arthritis -Treated as an outpatient   DVT prophylaxis: Lovenox Code Status: Full code Family Communication: Discussed with patient Disposition Plan: Home when ready Bed Type: MedSurg Consults called: None Obs/Inp: Inpatient  Pamella Pert, MD, PhD [facility]  Contact via [email or web address]  11/10/2019, 12:34 PM

## 2019-11-10 NOTE — Discharge Instructions (Addendum)
As we discussed, we would recommend being admitted.  Leaving now puts you at high risk of decompensation, including worsening breathing, which could cause death, extreme pain, disability, and morbidity.  If you decide to return for admission, return to the ER.

## 2019-11-13 LAB — CULTURE, BLOOD (ROUTINE X 2): Special Requests: ADEQUATE

## 2019-11-14 NOTE — Progress Notes (Signed)
ED Antimicrobial Stewardship Positive Culture Follow Up   Shawn Mooney is an 63 y.o. male who presented to [facility] on 11/10/2019 with a chief complaint of shortness of breath.  Chief Complaint  Patient presents with  . Shortness of Breath    Recent Results (from the past 720 hour(s))  Blood culture (routine x 2)     Status: None (Preliminary result)   Collection Time: 11/10/19 12:23 PM   Specimen: BLOOD  Result Value Ref Range Status   Specimen Description BLOOD BLOOD LEFT FOREARM  Final   Special Requests   Final    BOTTLES DRAWN AEROBIC AND ANAEROBIC Blood Culture adequate volume   Culture   Final    NO GROWTH 4 DAYS Performed at [facility], [address] [place], [place] [address]    Report Status PENDING  Incomplete  Blood culture (routine x 2)     Status: Abnormal   Collection Time: 11/10/19  1:20 PM   Specimen: BLOOD  Result Value Ref Range Status   Specimen Description   Final    BLOOD BLOOD LEFT FOREARM Performed at [facility], [address] [place], [place] [address]    Special Requests   Final    BOTTLES DRAWN AEROBIC AND ANAEROBIC Blood Culture adequate volume Performed at [facility], [address], [place], [place] [address]    Culture  Setup Time   Final    GRAM POSITIVE RODS AEROBIC BOTTLE ONLY CRITICAL RESULT CALLED TO, READ BACK BY AND VERIFIED WITHGwynn Burly AT 6948 11/11/2019 [place] Performed at [facility], [address], [place], [place] [address]    Culture (A)  Final    DIPHTHEROIDS(CORYNEBACTERIUM SPECIES) Standardized susceptibility testing for this organism is not available. Performed at [facility], [address], [place], [place] [address]    Report Status 11/13/2019 FINAL  Final   63 year-old patient presenting with SOB.  He is COVID-19 positive, and CT revealed multifocal pneumonia.  Patient also had blood cultures drawn in the ED with 1/4 bottles returning with C.  diphtheroids in the aerobic bottle.  No labs drawn, and patient left AMA with levofloxacin 750 mg q24 hours for 5 days.  Spoke with Dr. Jacqualine Code in the MD, and advised against treatment as C. dyphtheria is commonly a contaminant in blood cultures (especially when drawn in 1/4 bottles).  In the setting of COVID-19, Dr. Jacqualine Code was adamant on treating positive blood culture, and would like to treat with azithromycin 500 mg q24 hours for 14 days.    [x]  Patient discharged originally without antimicrobial agent and treatment is now indicated  New antibiotic prescription: Azithromycin 500 mg q24 hours for 14 days.    ED Provider: Dr. Delman Kitten  12/13 @ 1550:  Attempted to contact patient, but no answering machine. 12/13 @ 1555:  Called and spoke with emergency contact, [name] (sister).  No patient health information was disclosed, but she was asked to have her brother call [facility] at [phone] and ask to speak with a pharmacist.    [name], PharmD Pharmacy Resident  11/14/2019 3:57 PM

## 2019-11-15 LAB — CULTURE, BLOOD (ROUTINE X 2)
Culture: NO GROWTH
Special Requests: ADEQUATE

## 2021-03-19 ENCOUNTER — Other Ambulatory Visit: Payer: Self-pay

## 2021-03-19 ENCOUNTER — Emergency Department
Admission: EM | Admit: 2021-03-19 | Discharge: 2021-03-19 | Disposition: A | Discharge status: Home / Self Care | Payer: BC Managed Care – PPO | Attending: Emergency Medicine | Admitting: Emergency Medicine

## 2021-03-19 ENCOUNTER — Encounter: Payer: Self-pay | Admitting: Emergency Medicine

## 2021-03-19 DIAGNOSIS — Z8616 Personal history of COVID-19: Secondary | ICD-10-CM | POA: Insufficient documentation

## 2021-03-19 DIAGNOSIS — M069 Rheumatoid arthritis, unspecified: Secondary | ICD-10-CM | POA: Diagnosis not present

## 2021-03-19 DIAGNOSIS — F17200 Nicotine dependence, unspecified, uncomplicated: Secondary | ICD-10-CM | POA: Diagnosis not present

## 2021-03-19 DIAGNOSIS — M2550 Pain in unspecified joint: Secondary | ICD-10-CM | POA: Diagnosis present

## 2021-03-19 MED ORDER — OXYCODONE-ACETAMINOPHEN 7.5-325 MG PO TABS
1.0000 | ORAL_TABLET | Freq: Once | ORAL | Status: AC
  Administered 2021-03-19: 1 via ORAL
  Filled 2021-03-19: qty 1

## 2021-03-19 MED ORDER — HYDROCODONE-ACETAMINOPHEN 5-325 MG PO TABS: 1.0000 | ORAL_TABLET | Freq: Four times a day (QID) | ORAL | 0 refills | Status: DC | PRN

## 2021-03-19 MED ORDER — METHYLPREDNISOLONE 4 MG PO TBPK: ORAL_TABLET | ORAL | 0 refills | Status: DC

## 2021-03-19 MED ORDER — METHOTREXATE SODIUM 2.5 MG PO TABS: 2.5000 mg | ORAL_TABLET | ORAL | 2 refills | Status: DC

## 2021-03-19 MED ORDER — DEXAMETHASONE SODIUM PHOSPHATE 10 MG/ML IJ SOLN
10.0000 mg | Freq: Once | INTRAMUSCULAR | Status: AC
  Administered 2021-03-19: 10 mg via INTRAMUSCULAR
  Filled 2021-03-19: qty 1

## 2021-03-19 NOTE — Discharge Instructions (Signed)
Call and make a follow-up appointment with Dr. Karolee Stamps if you continue to have problems.  Also medications were sent to your pharmacy at [facility] including your methotrexate.

## 2021-03-19 NOTE — ED Provider Notes (Signed)
[facility] Emergency Department Provider Note  ____________________________________________   Event Date/Time   First MD Initiated Contact with Patient 03/19/21 [phone]     (approximate)  I have reviewed the triage vital signs and the nursing notes.   HISTORY  Chief Complaint Joint Pain   HPI Shawn Mooney is a 65 y.o. male presents to the ED via EMS with complaint of multiple joint pain.  Patient states that yesterday his hands began swelling and today more joints are involved and his hands are worse.  Patient states that he is out of medication.  Patient states he has arthritis but is not certain of a specific type.  Patient does acknowledge that he sees Dr. Lavenia Atlas.  Records indicate that his last office visit for rheumatoid arthritis flareup was 01/17/2021.  EMS reports that patient did have 1 refill on his methotrexate but patient has not taken this medication for the last 5 days.  Currently rates his pain as a 9 /10.       Past Medical History:  Diagnosis Date  . Arthritis     Patient Active Problem List   Diagnosis Date Noted  . Pneumonia due to COVID-19 virus 11/10/2019    History reviewed. No pertinent surgical history.  Prior to Admission medications   Medication Sig Start Date End Date Taking? Authorizing Provider  HYDROcodone-acetaminophen (NORCO/VICODIN) 5-325 MG tablet Take 1 tablet by mouth every 6 (six) hours as needed for moderate pain. 03/19/21 03/19/22 Yes Rhylee Nunn L, PA-C  methylPREDNISolone (MEDROL DOSEPAK) 4 MG TBPK tablet Taper dose pack as directed 03/19/21  Yes Bridget Hartshorn L, PA-C  methotrexate 2.5 MG tablet Take 1 tablet (2.5 mg total) by mouth once a week. 03/19/21   Tommi Rumps, PA-C    Allergies Patient has no known allergies.  No family history on file.  Social History Social History   Tobacco Use  . Smoking status: Current Every Day Smoker  . Smokeless tobacco: Never Used  Substance Use  Topics  . Alcohol use: Yes  . Drug use: No    Review of Systems Constitutional: No fever/chills Eyes: No visual changes. Cardiovascular: Denies chest pain. Respiratory: Denies shortness of breath. Gastrointestinal: No abdominal pain.  No nausea, no vomiting. Musculoskeletal: Multiple joint aches. Skin: Negative for rash. Neurological: Negative for headaches, focal weakness or numbness.   ____________________________________________   PHYSICAL EXAM:  VITAL SIGNS: ED Triage Vitals  Enc Vitals Group     BP 03/19/21 0943 (!) 138/96     Pulse --      Resp 03/19/21 0943 20     Temp 03/19/21 0943 98 F (36.7 C)     Temp Source 03/19/21 0943 Oral     SpO2 03/19/21 0943 100 %     Weight 03/19/21 0944 195 lb (88.5 kg)     Height 03/19/21 0944 6\' 2"  (1.88 m)     Head Circumference --      Peak Flow --      Pain Score 03/19/21 0944 9     Pain Loc --      Pain Edu? --      Excl. in GC? --     Constitutional: Alert and oriented. Well appearing and in no acute distress. Eyes: Conjunctivae are normal.  Head: Atraumatic. Neck: No stridor.   Cardiovascular: Normal rate, regular rhythm. Grossly normal heart sounds.  Good peripheral circulation. Respiratory: Normal respiratory effort.  No retractions. Lungs CTAB. Gastrointestinal: Soft and nontender. No distention.  Musculoskeletal: On exam most noticeable is  joints swollen and tender to his hands bilaterally.  He is mildly restricted with flexion and extension of his wrist secondary to pain.  Skin is intact and no evidence of injury.  Patient reports that this is the worst of his rheumatoid pain.  He has some mild generalized tenderness on palpation of his thoracic and lumbar spine.  Knees are minimally tender bilaterally but without effusion.  Patient is able to stand and ambulate without any assistance. Neurologic:  Normal speech and language. No gross focal neurologic deficits are appreciated.  Skin:  Skin is warm, dry and intact.  No rash noted. Psychiatric: Mood and affect are normal. Speech and behavior are normal.  ____________________________________________   LABS (all labs ordered are listed, but only abnormal results are displayed)  Labs Reviewed - No data to display  PROCEDURES  Procedure(s) performed (including Critical Care):  Procedures   ____________________________________________   INITIAL IMPRESSION / ASSESSMENT AND PLAN / ED COURSE  As part of my medical decision making, I reviewed the following data within the electronic MEDICAL RECORD NUMBER Notes from prior ED visits and [place] Controlled Substance Database  65 year old male presents to the ED via EMS with complaint of multiple joint pain.  Patient is being seen and treated by Dr. Elease Hashimoto for rheumatoid arthritis.  Patient states he has not taken his methotrexate in the last 5 days and also over-the-counter medication has not helped.  This morning he states that his hands are much worse than they were yesterday.  Patient has a history of seropositive rheumatoid arthritis.  Decadron 10 mg IM and Norco was given while in the ED.  A prescription for Norco was sent to the pharmacy along with prednisone.  Methotrexate 2.5 tablet weekly as previous prescribed by his rheumatologist.  Patient is encouraged to call make an appointment with his rheumatologist.  He is aware that the methotrexate has 2 refills on it until he can be seen.  Prior to discharge patient was up and walking around without any assistance with less pain.  ____________________________________________   FINAL CLINICAL IMPRESSION(S) / ED DIAGNOSES  Final diagnoses:  Rheumatoid arthritis flare [facility])     ED Discharge Orders         Ordered    methotrexate 2.5 MG tablet  Weekly        03/19/21 1054    methylPREDNISolone (MEDROL DOSEPAK) 4 MG TBPK tablet        03/19/21 1054    HYDROcodone-acetaminophen (NORCO/VICODIN) 5-325 MG tablet  Every 6 hours PRN        03/19/21 1054           *Please note:  Shawn Mooney was evaluated in Emergency Department on 03/19/2021 for the symptoms described in the history of present illness. He was evaluated in the context of the global COVID-19 pandemic, which necessitated consideration that the patient might be at risk for infection with the SARS-CoV-2 virus that causes COVID-19. Institutional protocols and algorithms that pertain to the evaluation of patients at risk for COVID-19 are in a state of rapid change based on information released by regulatory bodies including the CDC and federal and state organizations. These policies and algorithms were followed during the patient's care in the ED.  Some ED evaluations and interventions may be delayed as a result of limited staffing during and the pandemic.*   Note:  This document was prepared using Dragon voice recognition software and may include unintentional dictation errors.  Tommi Rumps, PA-C 03/19/21 1149    Jene Every, MD 03/19/21 1309

## 2021-03-19 NOTE — ED Triage Notes (Signed)
Presents via EMS from home with joint pain since Friday  Hand swelling since yesterday  No fever

## 2021-03-28 ENCOUNTER — Other Ambulatory Visit: Payer: Self-pay

## 2021-03-28 ENCOUNTER — Emergency Department
Admission: EM | Admit: 2021-03-28 | Discharge: 2021-03-28 | Disposition: A | Discharge status: Home / Self Care | Payer: BC Managed Care – PPO | Attending: Emergency Medicine | Admitting: Emergency Medicine

## 2021-03-28 DIAGNOSIS — M25511 Pain in right shoulder: Secondary | ICD-10-CM | POA: Diagnosis present

## 2021-03-28 DIAGNOSIS — F17200 Nicotine dependence, unspecified, uncomplicated: Secondary | ICD-10-CM | POA: Diagnosis not present

## 2021-03-28 DIAGNOSIS — Z8616 Personal history of COVID-19: Secondary | ICD-10-CM | POA: Insufficient documentation

## 2021-03-28 DIAGNOSIS — M069 Rheumatoid arthritis, unspecified: Secondary | ICD-10-CM | POA: Diagnosis not present

## 2021-03-28 MED ORDER — METHYLPREDNISOLONE 4 MG PO TBPK: ORAL_TABLET | ORAL | 0 refills | Status: DC

## 2021-03-28 MED ORDER — HYDROCODONE-ACETAMINOPHEN 5-325 MG PO TABS: 2.0000 | ORAL_TABLET | Freq: Four times a day (QID) | ORAL | 0 refills | Status: DC | PRN

## 2021-03-28 MED ORDER — IBUPROFEN 800 MG PO TABS: 800.0000 mg | ORAL_TABLET | Freq: Three times a day (TID) | ORAL | 0 refills | Status: DC | PRN

## 2021-03-28 MED ORDER — METHYLPREDNISOLONE SODIUM SUCC 125 MG IJ SOLR
125.0000 mg | Freq: Once | INTRAMUSCULAR | Status: AC
  Administered 2021-03-28: 125 mg via INTRAMUSCULAR
  Filled 2021-03-28: qty 2

## 2021-03-28 MED ORDER — HYDROCODONE-ACETAMINOPHEN 5-325 MG PO TABS
2.0000 | ORAL_TABLET | Freq: Once | ORAL | Status: AC
Start: 2021-03-28 — End: 2021-03-28
  Administered 2021-03-28: 2 via ORAL
  Filled 2021-03-28: qty 2

## 2021-03-28 NOTE — ED Provider Notes (Signed)
Shawn Mooney  ____________________________________________   Event Date/Time   First MD Initiated Contact with Patient 03/28/21 [phone]     (approximate)  I have reviewed the triage vital signs and the nursing notes.   HISTORY  Chief Complaint Joint Pain (Pt presenting via EMS from home. States he woke up this AM to get ready for work and "was barely able to get up" d/t pain. Reports pain in his joints, in hands, shoulders, elbows. Hx arthritis )    HPI Shawn Mooney is a 65 y.o. male with history of rheumatoid arthritis who presents to the emergency department with complaints of diffuse joint pain for the past day.  Was seen here in the emergency department on April 18 for the same.  Was given pain medication and a Medrol Dosepak.  States symptoms improved but he is not out of his pain medication.  He was given a refill for his methotrexate which he states he is taking daily.  It also appears that at some point patient was on a Remicade infusion every other month.  It appears that he last received this in January 2022.  He states he has not followed up or had any further infusions because "I have no way back-and-forth".  He denies any new injuries other than a trip and fall 2 days ago where he landed on his left knee.  He has been able to ambulate.  Did not hit his head or lose consciousness.  Denies current headaches, neck or back pain, chest or abdominal pain, fevers, cough, vomiting, diarrhea.     On review of his records it appears he was seen by his primary care doctor in February 2022 for the same and was provided with Medrol Dosepak, Norco at that time as well.  Last visit to his rheumatologist was in August 2021.     Past Medical History:  Diagnosis Date  . Arthritis     Patient Active Problem List   Diagnosis Date Noted  . Pneumonia due to COVID-19 virus 11/10/2019    History reviewed. No pertinent surgical  history.  Prior to Admission medications   Medication Sig Start Date End Date Taking? Authorizing Provider  HYDROcodone-acetaminophen (NORCO) 5-325 MG tablet Take 2 tablets by mouth every 6 (six) hours as needed for moderate pain. 03/28/21  Yes Milliani Herrada, Layla Maw, DO  ibuprofen (ADVIL) 800 MG tablet Take 1 tablet (800 mg total) by mouth every 8 (eight) hours as needed for mild pain. 03/28/21  Yes Cortlynn Hollinsworth, Layla Maw, DO  methylPREDNISolone (MEDROL DOSEPAK) 4 MG TBPK tablet Take as directed 03/28/21  Yes Iya Hamed N, DO  methotrexate 2.5 MG tablet Take 1 tablet (2.5 mg total) by mouth once a week. 03/19/21   Tommi Rumps, PA-C    Allergies Patient has no known allergies.  History reviewed. No pertinent family history.  Social History Social History   Tobacco Use  . Smoking status: Current Every Day Smoker  . Smokeless tobacco: Never Used  Substance Use Topics  . Alcohol use: Yes  . Drug use: No    Review of Systems Constitutional: No fever. Eyes: No visual changes. ENT: No sore throat. Cardiovascular: Denies chest pain. Respiratory: Denies shortness of breath. Gastrointestinal: No nausea, vomiting, diarrhea. Genitourinary: Negative for dysuria. Musculoskeletal: Negative for back pain. Skin: Negative for rash. Neurological: Negative for focal weakness or numbness.  ____________________________________________   PHYSICAL EXAM:  VITAL SIGNS: ED Triage Vitals  Enc Vitals Group  BP 03/28/21 0627 (!) 173/109     Pulse Rate 03/28/21 0627 79     Resp 03/28/21 0627 18     Temp 03/28/21 0627 99.2 F (37.3 C)     Temp Source 03/28/21 0627 Oral     SpO2 03/28/21 0627 100 %     Weight --      Height --      Head Circumference --      Peak Flow --      Pain Score 03/28/21 0629 10     Pain Loc --      Pain Edu? --      Excl. in GC? --    CONSTITUTIONAL: Alert and oriented and responds appropriately to questions. Well-appearing; well-nourished HEAD: Normocephalic,  atraumatic EYES: Conjunctivae clear, pupils appear equal, EOM appear intact ENT: normal nose; moist mucous membranes NECK: Supple, normal ROM no midline spinal tenderness or step-off or deformity CARD: RRR; S1 and S2 appreciated; no murmurs, no clicks, no rubs, no gallops RESP: Normal chest excursion without splinting or tachypnea; breath sounds clear and equal bilaterally; no wheezes, no rhonchi, no rales, no hypoxia or respiratory distress, speaking full sentences ABD/GI: Normal bowel sounds; non-distended; soft, non-tender, no rebound, no guarding, no peritoneal signs, no hepatosplenomegaly BACK: The back appears normal, no midline spinal tenderness or step-off or deformity EXT: Normal ROM in all joints; patient has tenderness over his bilateral shoulders, elbows, wrists, hands, knees, ankles.  No edema; no cyanosis, diffuse joint changes especially in the hands consistent with rheumatoid arthritis, no bony deformity noted, region to the left knee consistent with his previous fall, compartments soft, 2+ radial and DP pulses bilaterally, ambulates with normal gait, normal sensation diffusely, no calf tenderness or calf swelling SKIN: Normal color for age and race; warm; no rash on exposed skin NEURO: Moves all extremities equally PSYCH: The patient's mood and manner are appropriate.  ____________________________________________   LABS (all labs ordered are listed, but only abnormal results are displayed)  Labs Reviewed - No data to display ____________________________________________  EKG   ____________________________________________  RADIOLOGY I, Arman Loy, personally viewed and evaluated these images (plain radiographs) as part of my medical decision making, as well as reviewing the written report by the radiologist.  ED MD interpretation:    Official radiology report(s): No results found.  ____________________________________________   PROCEDURES  Procedure(s)  performed (including Critical Care):  Procedures    ____________________________________________   INITIAL IMPRESSION / ASSESSMENT AND PLAN / ED COURSE  As part of my medical decision making, I reviewed the following data within the electronic MEDICAL RECORD NUMBER Nursing notes reviewed and incorporated, Old chart reviewed, Notes from prior ED visits and [place] Controlled Substance Database         Patient here with rheumatoid arthritis flare.  It seems that patient continues to return to the emergency department as he does not have a ride to the rheumatology clinic.  He arrives here today by EMS.  No signs of superimposed infection.  Otherwise well-appearing, nontoxic.  Reports he is taking his methotrexate.  Refills were provided during his last ED visit on April 18.  Will give dose of IM Solu-Medrol here and discharged again with Medrol Dosepak, Norco, ibuprofen (no history of chronic kidney disease and review of his last several creatinines have been normal) and have him follow-up with his rheumatologist.  I have discussed with him that this will need close follow-up with his outpatient rheumatologist and likely be restarted on Remicade.  I  have discussed with him that the emergency department is not the appropriate place to have his chronic pain managed.  He does report a fall 2 days ago but no sign of injury on exam.  Reports he fell onto his left knee but is able to ambulate here and has no bony deformity.  Does have an abrasion to this knee.  Have offered him an x-ray but he declines.    At this time, I do not feel there is any life-threatening condition present. I have reviewed, interpreted and discussed all results (EKG, imaging, lab, urine as appropriate) and exam findings with patient/family. I have reviewed nursing notes and appropriate previous records.  I feel the patient is safe to be discharged home without further emergent workup and can continue workup as an outpatient as needed.  Discussed usual and customary return precautions. Patient/family verbalize understanding and are comfortable with this plan.  Outpatient follow-up has been provided as needed. All questions have been answered.    ____________________________________________   FINAL CLINICAL IMPRESSION(S) / ED DIAGNOSES  Final diagnoses:  Rheumatoid arthritis flare [facility])     ED Discharge Orders         Ordered    HYDROcodone-acetaminophen (NORCO) 5-325 MG tablet  Every 6 hours PRN        03/28/21 0704    ibuprofen (ADVIL) 800 MG tablet  Every 8 hours PRN        03/28/21 0704    methylPREDNISolone (MEDROL DOSEPAK) 4 MG TBPK tablet        03/28/21 8916          *Please Mooney:  Jamie Brookes was evaluated in Emergency Department on 03/28/2021 for the symptoms described in the history of present illness. He was evaluated in the context of the global COVID-19 pandemic, which necessitated consideration that the patient might be at risk for infection with the SARS-CoV-2 virus that causes COVID-19. Institutional protocols and algorithms that pertain to the evaluation of patients at risk for COVID-19 are in a state of rapid change based on information released by regulatory bodies including the CDC and federal and state organizations. These policies and algorithms were followed during the patient's care in the ED.  Some ED evaluations and interventions may be delayed as a result of limited staffing during and the pandemic.*   Mooney:  This document was prepared using Dragon voice recognition software and may include unintentional dictation errors.   Lateefah Mallery, Layla Maw, DO 03/28/21 [phone]

## 2021-03-28 NOTE — ED Notes (Signed)
Chief Complaint  Patient presents with  . Joint Pain    Pt presenting via EMS from home. States he woke up this AM to get ready for work and "was barely able to get up" d/t pain. Reports pain in his joints, in hands, shoulders, elbows. Hx arthritis    Pt presenting with the above complaint. Was seen here recently for same. Pt reports he ran out of methotrexate on Friday, has been unable to contact PCP for refill. Denies numbness/tingling to extremities. AAOx4 NAD

## 2021-03-28 NOTE — Discharge Instructions (Addendum)
Please call and make an appointment to see your rheumatologist.  Please take your methotrexate as prescribed.  You are being provided a prescription for opiates (also known as narcotics) for pain control.  Opiates can be addictive and should only be used when absolutely necessary for pain control when other alternatives do not work.  We recommend you only use them for the recommended amount of time and only as prescribed.  Please do not take with other sedative medications or alcohol.  Please do not drive, operate machinery, make important decisions while taking opiates.  Please note that these medications can be addictive and have high abuse potential.  Patients can become addicted to narcotics after only taking them for a few days.  Please keep these medications locked away from children, teenagers or any family members with history of substance abuse.  Narcotic pain medicine may also make you constipated.  You may use over-the-counter medications such as MiraLAX, Colace to prevent constipation.  If you become constipated you may use over-the-counter enemas as needed.  Itching and nausea are common side effects of narcotic pain medication.  If you develop uncontrolled vomiting or a rash, please stop these medications.

## 2023-01-09 ENCOUNTER — Emergency Department: Payer: Self-pay

## 2023-01-09 ENCOUNTER — Emergency Department
Admission: EM | Admit: 2023-01-09 | Discharge: 2023-01-09 | Disposition: A | Discharge status: Home / Self Care | Payer: Self-pay | Attending: Emergency Medicine | Admitting: Emergency Medicine

## 2023-01-09 ENCOUNTER — Other Ambulatory Visit: Payer: Self-pay

## 2023-01-09 DIAGNOSIS — M2550 Pain in unspecified joint: Secondary | ICD-10-CM | POA: Insufficient documentation

## 2023-01-09 DIAGNOSIS — M25462 Effusion, left knee: Secondary | ICD-10-CM | POA: Insufficient documentation

## 2023-01-09 LAB — CBC
HCT: 31.8 % — ABNORMAL LOW (ref 39.0–52.0)
Hemoglobin: 9.9 g/dL — ABNORMAL LOW (ref 13.0–17.0)
MCH: 26.1 pg (ref 26.0–34.0)
MCHC: 31.1 g/dL (ref 30.0–36.0)
MCV: 83.7 fL (ref 80.0–100.0)
Platelets: 282 10*3/uL (ref 150–400)
RBC: 3.8 MIL/uL — ABNORMAL LOW (ref 4.22–5.81)
RDW: 16.8 % — ABNORMAL HIGH (ref 11.5–15.5)
WBC: 5.7 10*3/uL (ref 4.0–10.5)
nRBC: 0 % (ref 0.0–0.2)

## 2023-01-09 LAB — COMPREHENSIVE METABOLIC PANEL
ALT: 8 U/L (ref 0–44)
AST: 15 U/L (ref 15–41)
Albumin: 3.4 g/dL — ABNORMAL LOW (ref 3.5–5.0)
Alkaline Phosphatase: 72 U/L (ref 38–126)
Anion gap: 7 (ref 5–15)
BUN: 21 mg/dL (ref 8–23)
CO2: 24 mmol/L (ref 22–32)
Calcium: 9.2 mg/dL (ref 8.9–10.3)
Chloride: 102 mmol/L (ref 98–111)
Creatinine, Ser: 1.1 mg/dL (ref 0.61–1.24)
GFR, Estimated: >60 mL/min (ref >60)
Glucose, Bld: 130 mg/dL — ABNORMAL HIGH (ref 70–99)
Potassium: 4 mmol/L (ref 3.5–5.1)
Sodium: 133 mmol/L — ABNORMAL LOW (ref 135–145)
Total Bilirubin: 0.9 mg/dL (ref 0.3–1.2)
Total Protein: 7.5 g/dL (ref 6.5–8.1)

## 2023-01-09 LAB — SYNOVIAL CELL COUNT + DIFF, W/ CRYSTALS
Crystals, Fluid: NONE SEEN
Eosinophils-Synovial: 0 %
Lymphocytes-Synovial Fld: 44 %
Monocyte-Macrophage-Synovial Fluid: 5 %
Neutrophil, Synovial: 51 %
WBC, Synovial: 6676 /mm3 — ABNORMAL HIGH (ref 0–200)

## 2023-01-09 MED ORDER — LIDOCAINE HCL (PF) 1 % IJ SOLN
5.0000 mL | Freq: Once | INTRAMUSCULAR | Status: AC
  Administered 2023-01-09: 5 mL
  Filled 2023-01-09: qty 5

## 2023-01-09 MED ORDER — HYDROCODONE-ACETAMINOPHEN 5-325 MG PO TABS: 1.0000 | ORAL_TABLET | Freq: Three times a day (TID) | ORAL | 0 refills | Status: AC | PRN

## 2023-01-09 MED ORDER — HYDROCODONE-ACETAMINOPHEN 5-325 MG PO TABS
1.0000 | ORAL_TABLET | Freq: Once | ORAL | Status: AC
  Administered 2023-01-09: 1 via ORAL
  Filled 2023-01-09: qty 1

## 2023-01-09 MED ORDER — PREDNISONE 20 MG PO TABS
20.0000 mg | ORAL_TABLET | Freq: Once | ORAL | Status: AC
  Administered 2023-01-09: 20 mg via ORAL
  Filled 2023-01-09: qty 1

## 2023-01-09 MED ORDER — HYDROCODONE-ACETAMINOPHEN 5-325 MG PO TABS: 1.0000 | ORAL_TABLET | Freq: Three times a day (TID) | ORAL | 0 refills | Status: DC | PRN

## 2023-01-09 MED ORDER — PREDNISONE 5 MG PO TABS: 5.0000 mg | ORAL_TABLET | Freq: Every day | ORAL | 0 refills | Status: DC

## 2023-01-09 MED ORDER — METHOTREXATE SODIUM 2.5 MG PO TABS: 8.0000 mg | ORAL_TABLET | ORAL | 1 refills | Status: DC

## 2023-01-09 MED ORDER — METHOTREXATE SODIUM 2.5 MG PO TABS: 8.0000 mg | ORAL_TABLET | ORAL | 1 refills | Status: AC

## 2023-01-09 MED ORDER — PREDNISONE 5 MG PO TABS: 5.0000 mg | ORAL_TABLET | Freq: Every day | ORAL | 0 refills | Status: AC

## 2023-01-09 NOTE — ED Notes (Signed)
Discharge instructions explained to patient and family at this time. Patient and family state they understand and agree.   

## 2023-01-09 NOTE — ED Triage Notes (Addendum)
Pt in from home due to wrist/hand/knee swelling; soreness in knees per pt; knees wrapped by pt at home; started last Wednesday; unknown hx and meds; VSS with EMS. Pt denies SOB; resp reg/unlabored, skin dry and sitting calmly. Pt denies cardiac/kidney condition hx.

## 2023-01-09 NOTE — ED Notes (Signed)
Patient is resting comfortably. 

## 2023-01-09 NOTE — ED Provider Notes (Addendum)
[facility] Emergency Department Provider Note     Event Date/Time   First MD Initiated Contact with Patient 01/09/23 1444     (approximate)   History   Leg Swelling (In knees, wrists/hands)   HPI  Shawn Mooney is a 67 y.o. male with a history of arthritis, presents to the ED from home via EMS.  Patient arrives with 1 week complaint of pain to the wrists, hands, and knees as well as swelling to the same.  Patient apparently wrapped his knees with Ace bandages at home prior to arrival.  Patient also notes some swelling to the left greater than right knees.  Denies any recent injury, trauma, falls, or fevers.  Patient does have medications at home but is unclear of the names of the medications or the indications for the same meds.  Chart review reveals that the patient's been under the care of of rheumatology at [facility], last seen more than a year ago by Dr. Sammuel Bailiff.  Patient last had his refills for methotrexate, prednisone, and folic acid.  Further chart review also confirms patient with seropositive rheumatoid arthritis of multiple sites as his primary diagnosis.   Physical Exam   Triage Vital Signs: ED Triage Vitals  Enc Vitals Group     BP 01/09/23 1327 131/74     Pulse Rate 01/09/23 1327 87     Resp 01/09/23 1327 19     Temp 01/09/23 1327 97.7 F (36.5 C)     Temp Source 01/09/23 1327 Oral     SpO2 01/09/23 1327 100 %     Weight 01/09/23 1330 198 lb (89.8 kg)     Height 01/09/23 1330 6' 1"$  (1.854 m)     Head Circumference --      Peak Flow --      Pain Score 01/09/23 1329 10     Pain Loc --      Pain Edu? --      Excl. in [place]? --     Most recent vital signs: Vitals:   01/09/23 1827 01/09/23 1841  BP: 130/72 130/72  Pulse: 84 84  Resp: 18 18  Temp: 98 F (36.7 C) 98 F (36.7 C)  SpO2: 98% 98%    General Awake, no distress. NAD CV:  Good peripheral perfusion.  RESP:  Normal effort.  ABD:  No distention.   MSK:  Patient with large hands and feet as well as heavy brow, consistent with acromegaly.  Right knee with moderate joint effusion appreciated.  Full active range of motion of the knees bilaterally.   ED Results / Procedures / Treatments   Labs (all labs ordered are listed, but only abnormal results are displayed) Labs Reviewed  CBC - Abnormal; Notable for the following components:      Result Value   RBC 3.80 (*)    Hemoglobin 9.9 (*)    HCT 31.8 (*)    RDW 16.8 (*)    All other components within normal limits  COMPREHENSIVE METABOLIC PANEL - Abnormal; Notable for the following components:   Sodium 133 (*)    Glucose, Bld 130 (*)    Albumin 3.4 (*)    All other components within normal limits  SYNOVIAL CELL COUNT + DIFF, W/ CRYSTALS - Abnormal; Notable for the following components:   Appearance-Synovial CLOUDY (*)    WBC, Synovial 6,676 (*)    All other components within normal limits  BODY FLUID CULTURE W GRAM STAIN  GLUCOSE, BODY FLUID OTHER            PROTEIN, BODY FLUID (OTHER)  URIC ACID, BODY FLUID     EKG   RADIOLOGY  I personally viewed and evaluated these images as part of my medical decision making, as well as reviewing the written report by the radiologist.  ED Provider Interpretation: No acute findings.  Joint effusion appreciated.  DG Knee Complete 4 Views Left  Result Date: 01/09/2023 CLINICAL DATA:  67 year old male presents for evaluation of pain and swelling in the knee. EXAM: LEFT KNEE - COMPLETE 4+ VIEW COMPARISON:  December 17, 2018 FINDINGS: Soft tissue swelling about the knee with moderate suprapatellar effusion. No visible fracture. No sign of dislocation. Vascular calcifications in the soft tissue. Minimal degenerative changes about the knee. IMPRESSION: 1. Soft tissue swelling about the knee with moderate suprapatellar effusion. No visible fracture. Correlate with any history of trauma or infection. 2. Very mild degenerative changes about the knee.  3. Vascular calcifications in the soft tissue. Electronically Signed   By: Zetta Bills M.D.   On: 01/09/2023 15:23     PROCEDURES:  Critical Care performed: No  .Joint Aspiration/Arthrocentesis  Date/Time: 01/09/2023 4:49 PM  Performed by: Melvenia Needles, PA-C Authorized by: Melvenia Needles, PA-C   Consent:    Consent obtained:  Verbal   Consent given by:  Patient   Risks, benefits, and alternatives were discussed: yes     Risks discussed:  Pain and incomplete drainage   Alternatives discussed:  No treatment Universal protocol:    Imaging studies available: yes     Site/side marked: yes     Patient identity confirmed:  Verbally with patient Location:    Location:  Knee   Knee:  L knee Anesthesia:    Anesthesia method:  Local infiltration   Local anesthetic:  Lidocaine 1% w/o epi Procedure details:    Preparation: Patient was prepped and draped in usual sterile fashion     Needle gauge:  18 G   Ultrasound guidance: no     Approach:  Lateral   Aspirate amount:  80 cc   Aspirate characteristics:  Serous and blood-tinged   Steroid injected: no     Specimen collected: yes   Post-procedure details:    Dressing:  Gauze roll   Procedure completion:  Tolerated well, no immediate complications    MEDICATIONS ORDERED IN ED: Medications  lidocaine (PF) (XYLOCAINE) 1 % injection 5 mL (5 mLs Infiltration Given 01/09/23 1640)  predniSONE (DELTASONE) tablet 20 mg (20 mg Oral Given 01/09/23 1643)  HYDROcodone-acetaminophen (NORCO/VICODIN) 5-325 MG per tablet 1 tablet (1 tablet Oral Given 01/09/23 1727)     IMPRESSION / MDM / [place] / ED COURSE  I reviewed the triage vital signs and the nursing notes.                              Differential diagnosis includes, but is not limited to, RA, DJD, septic joint, gouty arthritis, inflammatory effusion  Patient's presentation is most consistent with acute complicated illness / injury requiring diagnostic  workup.  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  Patient consented to and tolerated a left knee joint aspiration, with synovial fluid sent for evaluation.  Patient otherwise stable on exam, without exquisite pain or concern for acute gouty  arthritis, or septic joint.  Patient was unable to stay in the ED for the results of his synovial fluid specimen, and asked to be discharged.  He did endorse improvement of his pain overall at the time of his disposition.  Patient will be discharged home with prescriptions for hydrocodone, prednisone, and methotrexate.  Patient will be notified via telephone for any abnormalities to his synovial fluid analysis.  Patient is to follow up with rheumatology as needed or otherwise directed. Patient is given ED precautions to return to the ED for any worsening or new symptoms.   FINAL CLINICAL IMPRESSION(S) / ED DIAGNOSES   Final diagnoses:  Knee effusion, left  Arthralgia, unspecified joint     Rx / DC Orders   ED Discharge Orders          Ordered    methotrexate (RHEUMATREX) 2.5 MG tablet  Weekly,   Status:  Discontinued        01/09/23 1658    predniSONE (DELTASONE) 5 MG tablet  Daily with breakfast,   Status:  Discontinued        01/09/23 1658    HYDROcodone-acetaminophen (NORCO) 5-325 MG tablet  3 times daily PRN,   Status:  Discontinued        01/09/23 1658    HYDROcodone-acetaminophen (NORCO) 5-325 MG tablet  3 times daily PRN        01/09/23 1846    methotrexate (RHEUMATREX) 2.5 MG tablet  Weekly        01/09/23 1846    predniSONE (DELTASONE) 5 MG tablet  Daily with breakfast        01/09/23 1846             Note:  This document was prepared using Dragon voice recognition software and may include unintentional dictation errors.    Melvenia Needles, PA-C 01/09/23 1859    Melvenia Needles,  PA-C 01/09/23 1901    Lucillie Garfinkel, MD 01/10/23 [phone]

## 2023-01-09 NOTE — ED Notes (Addendum)
Pt states has 3 bottles of meds at home and that one bottle has been out for a while; pt unsure of what names of meds are or what they are for. Only hx pt knows of is arthritis. Pt denies CP and SOB. Pt in NAD. Both hands are obviously swollen. Pt unable to make full fist due to swelling.

## 2023-01-09 NOTE — Discharge Instructions (Addendum)
Your exam and XR are consistent with fluid on the knee and rheumatoid arthritis. Take the prescription meds as directed. You should follow-up with Dr. Posey Pronto at [facility] for ongoing care of your arthritis and joint pain. Your synovial fluid tests are pending at time.

## 2023-01-13 LAB — BODY FLUID CULTURE W GRAM STAIN
Culture: NO GROWTH
Gram Stain: NONE SEEN
Special Requests: NORMAL

## 2023-01-13 LAB — GLUCOSE, BODY FLUID OTHER: Glucose, Body Fluid Other: 25 mg/dL

## 2023-01-13 LAB — PROTEIN, BODY FLUID (OTHER): Total Protein, Body Fluid Other: 5.2 g/dL

## 2023-01-13 LAB — URIC ACID, BODY FLUID: Uric Acid Body Fluid: 6.7 mg/dL

## 2023-09-26 ENCOUNTER — Emergency Department
Admission: EM | Admit: 2023-09-26 | Discharge: 2023-09-26 | Disposition: A | Discharge status: Home / Self Care | Payer: Medicare HMO | Attending: Emergency Medicine | Admitting: Emergency Medicine

## 2023-09-26 ENCOUNTER — Other Ambulatory Visit: Payer: Self-pay

## 2023-09-26 DIAGNOSIS — M25532 Pain in left wrist: Secondary | ICD-10-CM | POA: Diagnosis not present

## 2023-09-26 DIAGNOSIS — M25571 Pain in right ankle and joints of right foot: Secondary | ICD-10-CM

## 2023-09-26 DIAGNOSIS — M25562 Pain in left knee: Secondary | ICD-10-CM | POA: Diagnosis not present

## 2023-09-26 DIAGNOSIS — Z8616 Personal history of COVID-19: Secondary | ICD-10-CM | POA: Diagnosis not present

## 2023-09-26 DIAGNOSIS — M2550 Pain in unspecified joint: Secondary | ICD-10-CM | POA: Diagnosis present

## 2023-09-26 DIAGNOSIS — M25531 Pain in right wrist: Secondary | ICD-10-CM | POA: Diagnosis not present

## 2023-09-26 DIAGNOSIS — M25572 Pain in left ankle and joints of left foot: Secondary | ICD-10-CM | POA: Diagnosis not present

## 2023-09-26 DIAGNOSIS — M25561 Pain in right knee: Secondary | ICD-10-CM | POA: Insufficient documentation

## 2023-09-26 LAB — BASIC METABOLIC PANEL
Anion gap: 10 (ref 5–15)
BUN: 13 mg/dL (ref 8–23)
CO2: 22 mmol/L (ref 22–32)
Calcium: 9.3 mg/dL (ref 8.9–10.3)
Chloride: 102 mmol/L (ref 98–111)
Creatinine, Ser: 0.91 mg/dL (ref 0.61–1.24)
GFR, Estimated: >60 mL/min (ref >60)
Glucose, Bld: 102 mg/dL — ABNORMAL HIGH (ref 70–99)
Potassium: 3.9 mmol/L (ref 3.5–5.1)
Sodium: 134 mmol/L — ABNORMAL LOW (ref 135–145)

## 2023-09-26 LAB — CBC
HCT: 31.3 % — ABNORMAL LOW (ref 39.0–52.0)
Hemoglobin: 10.2 g/dL — ABNORMAL LOW (ref 13.0–17.0)
MCH: 27.6 pg (ref 26.0–34.0)
MCHC: 32.6 g/dL (ref 30.0–36.0)
MCV: 84.8 fL (ref 80.0–100.0)
Platelets: 301 10*3/uL (ref 150–400)
RBC: 3.69 MIL/uL — ABNORMAL LOW (ref 4.22–5.81)
RDW: 14.9 % (ref 11.5–15.5)
WBC: 6.1 10*3/uL (ref 4.0–10.5)
nRBC: 0 % (ref 0.0–0.2)

## 2023-09-26 LAB — URIC ACID: Uric Acid, Serum: 7 mg/dL (ref 3.7–8.6)

## 2023-09-26 MED ORDER — PREDNISONE 50 MG PO TABS: ORAL_TABLET | ORAL | 0 refills | Status: AC

## 2023-09-26 MED ORDER — METHYLPREDNISOLONE SODIUM SUCC 125 MG IJ SOLR
125.0000 mg | Freq: Once | INTRAMUSCULAR | Status: AC
  Administered 2023-09-26: 125 mg via INTRAVENOUS
  Filled 2023-09-26: qty 2

## 2023-09-26 MED ORDER — KETOROLAC TROMETHAMINE 15 MG/ML IJ SOLN
15.0000 mg | Freq: Once | INTRAMUSCULAR | Status: AC
  Administered 2023-09-26: 15 mg via INTRAVENOUS
  Filled 2023-09-26: qty 1

## 2023-09-26 NOTE — ED Triage Notes (Signed)
Pt sts that he has been having joint swelling for the last two weeks. Pt sts that he can hardly walk cause it hurts so bad. Pt has swelling to the wrists, ankles, knees

## 2023-09-26 NOTE — Discharge Instructions (Signed)
You may take the medications as prescribed.  Please follow-up with your rheumatologist for further infusions.  Please return for any new, worsening, or change in symptoms or other concerns.  It was a pleasure caring for you today.

## 2023-09-26 NOTE — ED Provider Notes (Signed)
[facility] Provider Note    Event Date/Time   First MD Initiated Contact with Patient 09/26/23 1432     (approximate)   History   Joint Swelling   HPI  Shawn Mooney is a 67 y.o. male with a past medical history of rheumatoid arthritis on infusions who presents today for evaluation of joint pain.  Patient reports that he missed his last infusion because of transportation issues.  He denies fevers or chills.  He reports that this feels the same as his previous rheumatoid arthritis flares.  No injuries.  He is able to ambulate.  Patient Active Problem List   Diagnosis Date Noted   Pneumonia due to COVID-19 virus 11/10/2019          Physical Exam   Triage Vital Signs: ED Triage Vitals  Encounter Vitals Group     BP 09/26/23 1339 126/87     Systolic BP Percentile --      Diastolic BP Percentile --      Pulse Rate 09/26/23 1339 86     Resp 09/26/23 1339 18     Temp 09/26/23 1339 98.1 F (36.7 C)     Temp Source 09/26/23 1339 Oral     SpO2 09/26/23 1339 97 %     Weight 09/26/23 1340 200 lb (90.7 kg)     Height 09/26/23 1340 6\' 1"  (1.854 m)     Head Circumference --      Peak Flow --      Pain Score 09/26/23 1339 10     Pain Loc --      Pain Education --      Exclude from Growth Chart --     Most recent vital signs: Vitals:   09/26/23 1339 09/26/23 1634  BP: 126/87 (!) 140/92  Pulse: 86 73  Resp: 18 18  Temp: 98.1 F (36.7 C)   SpO2: 97% 100%    Physical Exam Vitals and nursing note reviewed.  Constitutional:      General: Awake and alert. No acute distress.    Appearance: Normal appearance. The patient is normal weight.  HENT:     Head: Normocephalic and atraumatic.     Mouth: Mucous membranes are moist.  Eyes:     General: PERRL. Normal EOMs        Right eye: No discharge.        Left eye: No discharge.     Conjunctiva/sclera: Conjunctivae normal.  Cardiovascular:     Rate and Rhythm: Normal rate and regular rhythm.      Pulses: Normal pulses.  Pulmonary:     Effort: Pulmonary effort is normal. No respiratory distress.     Breath sounds: Normal breath sounds.  Abdominal:     Abdomen is soft. There is no abdominal tenderness. No rebound or guarding. No distention. Musculoskeletal:        General: No swelling. Normal range of motion.     Cervical back: Normal range of motion and neck supple.  Swelling without erythema to bilateral knees and bilateral wrists.  Full range of motion, no pain with range of motion.  No warmth or erythema noted. Skin:    General: Skin is warm and dry.     Capillary Refill: Capillary refill takes less than 2 seconds.     Findings: No rash.  Neurological:     Mental Status: The patient is awake and alert.      ED Results / Procedures / Treatments  Labs (all labs ordered are listed, but only abnormal results are displayed) Labs Reviewed  CBC - Abnormal; Notable for the following components:      Result Value   RBC 3.69 (*)    Hemoglobin 10.2 (*)    HCT 31.3 (*)    All other components within normal limits  BASIC METABOLIC PANEL - Abnormal; Notable for the following components:   Sodium 134 (*)    Glucose, Bld 102 (*)    All other components within normal limits  URIC ACID     EKG     RADIOLOGY     PROCEDURES:  Critical Care performed:   Procedures   MEDICATIONS ORDERED IN ED: Medications  methylPREDNISolone sodium succinate (SOLU-MEDROL) 125 mg/2 mL injection 125 mg (125 mg Intravenous Given 09/26/23 1624)  ketorolac (TORADOL) 15 MG/ML injection 15 mg (15 mg Intravenous Given 09/26/23 1629)     IMPRESSION / MDM / ASSESSMENT AND PLAN / ED COURSE  I reviewed the triage vital signs and the nursing notes.   Differential diagnosis includes, but is not limited to, rheumatoid arthritis flare, effusion, less likely septic joint.  I reviewed the patient's chart.  She is followed by [facility] rheumatology and gets infusions for his rheumatoid arthritis.   He canceled his infusion on 10/2 and had an fusion scheduled for 10/9 but I do not see that he actually underwent this infusion.  Patient reports that he did not get this infusion.  I suspect rheumatoid arthritis flare in the setting of missing his infusion.  He has no warmth, erythema, or fever/chills to suggest infection in his joints.  His blood work is overall reassuring.  No leukocytosis.  He is able to ambulate with a steady gait.  No trauma to suggest bony injury.  He has normal active and passive range of motion of all of his affected joints, do not suspect septic joint.  He was given Solu-Medrol for suspected rheumatoid arthritis flare, with significant improvement of his symptoms.  He was darted on a prednisone burst.  He is not diabetic.  Recommended that he follow-up with his rheumatologist for his routine infusions.  We discussed return precautions in the meantime.  Patient or stands and agrees with plan.  He was discharged in stable condition.   Patient's presentation is most consistent with acute complicated illness / injury requiring diagnostic workup.      FINAL CLINICAL IMPRESSION(S) / ED DIAGNOSES   Final diagnoses:  Arthralgia of both knees  Arthralgia of both ankles  Pain of both wrist joints     Rx / DC Orders   ED Discharge Orders          Ordered    predniSONE (DELTASONE) 50 MG tablet        09/26/23 1744             Note:  This document was prepared using Dragon voice recognition software and may include unintentional dictation errors.   Jackelyn Hoehn, PA-C 09/26/23 Ali Lowe, MD 10/03/23 1136
